# Patient Record
Sex: Female | Born: 1980
Health system: Southern US, Community
[De-identification: ages and names within clinical notes are randomized; demographics above are authoritative.]

## PROBLEM LIST (undated history)

## (undated) HISTORY — PX: BACK SURGERY: SHX140

---

## 2007-08-06 ENCOUNTER — Ambulatory Visit (HOSPITAL_COMMUNITY): Admission: RE | Admit: 2007-08-06 | Discharge: 2007-08-06 | Payer: Self-pay | Admitting: Obstetrics & Gynecology

## 2007-10-21 ENCOUNTER — Inpatient Hospital Stay (HOSPITAL_COMMUNITY): Admission: AD | Admit: 2007-10-21 | Discharge: 2007-10-24 | Payer: Self-pay | Admitting: Obstetrics & Gynecology

## 2008-12-15 ENCOUNTER — Ambulatory Visit (HOSPITAL_COMMUNITY): Admission: RE | Admit: 2008-12-15 | Discharge: 2008-12-15 | Payer: Self-pay | Admitting: Family Medicine

## 2009-10-16 IMAGING — CT CT PELVIS W/ CM
1 series · 15 of 32 positions shown, 19 images · IV contrast (agent unspecified)
Comparison: None

CT ABDOMEN

CLINICAL DATA: Fever.  Right lower quadrant pain.

CT ABDOMEN AND PELVIS WITH CONTRAST
TECHNIQUE: Multidetector CT imaging of the abdomen and pelvis was
performed using the standard protocol following bolus
administration of intravenous contrast.
Contrast: 100 ml of omni 300.,

[Series 2: rtn ap with st · axial · 0.62mm/px · z∈[-412,+3]mm · 15 of 92 slices shown, 19 images]
[im 6/92  soft-tissue]
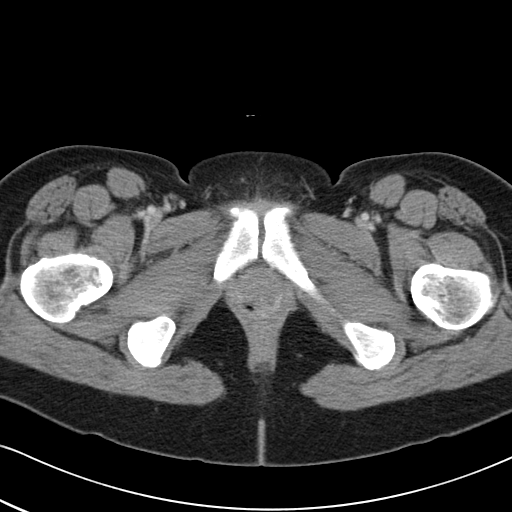
[im 6/92  bone]
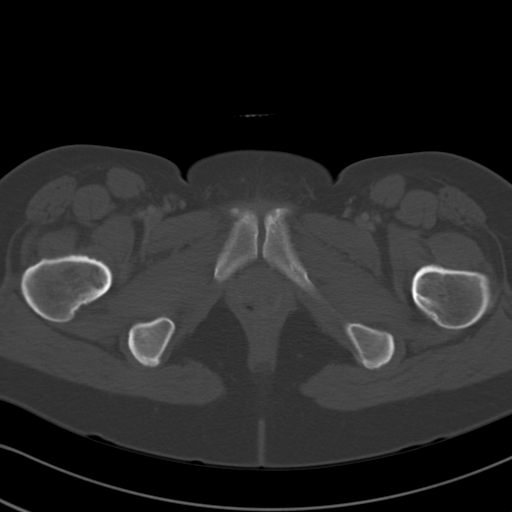
[im 12/92  soft-tissue]
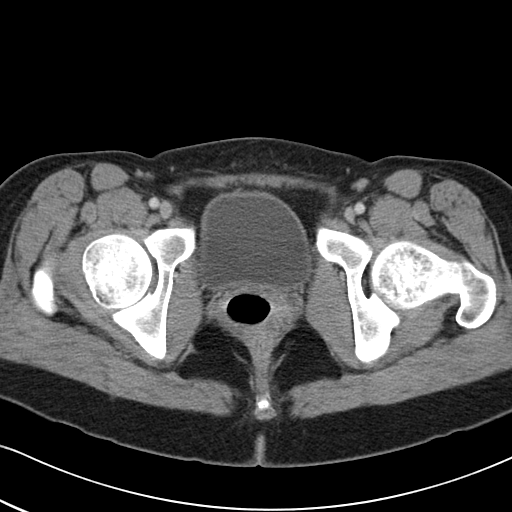
[im 18/92  soft-tissue]
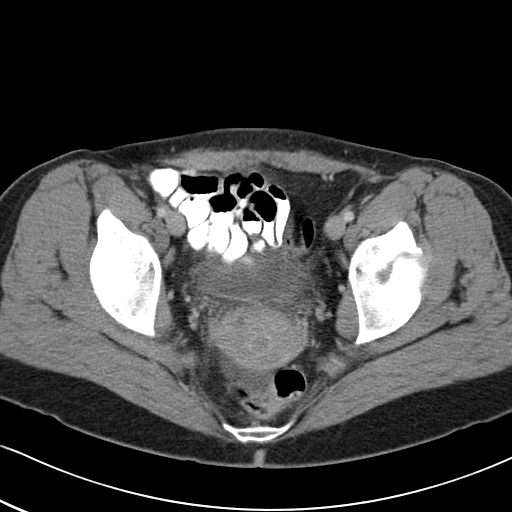
[im 27/92  soft-tissue]
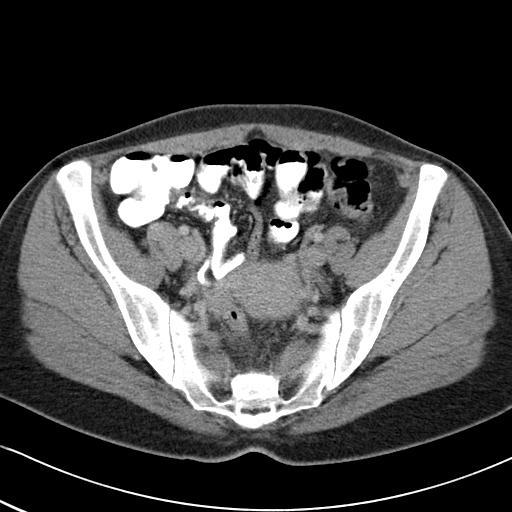
[im 33/92  soft-tissue]
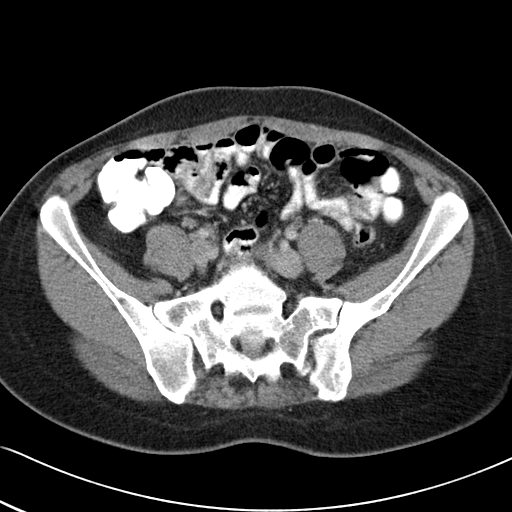
[im 39/92  soft-tissue]
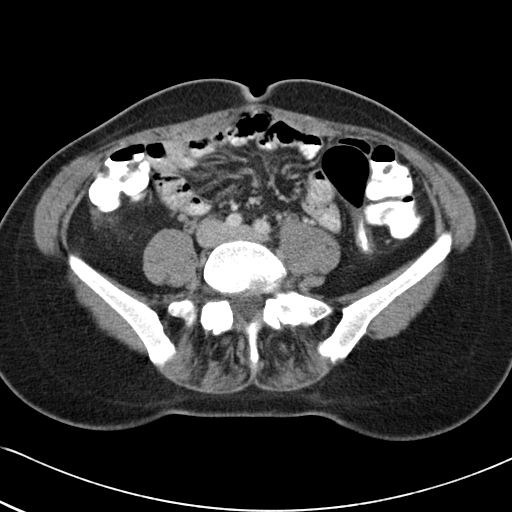
[im 47/92  soft-tissue]
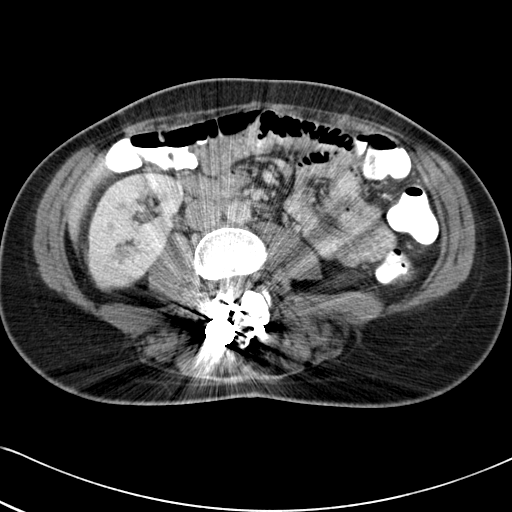
[im 53/92  soft-tissue]
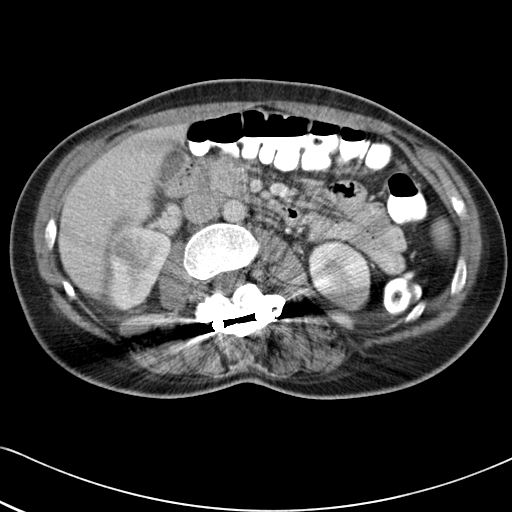
[im 59/92  soft-tissue]
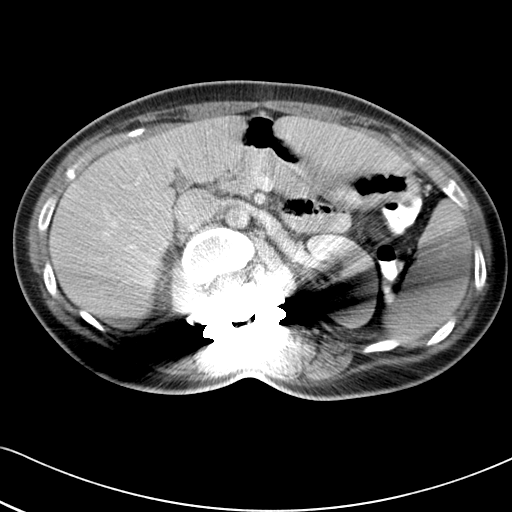
[im 59/92  bone]
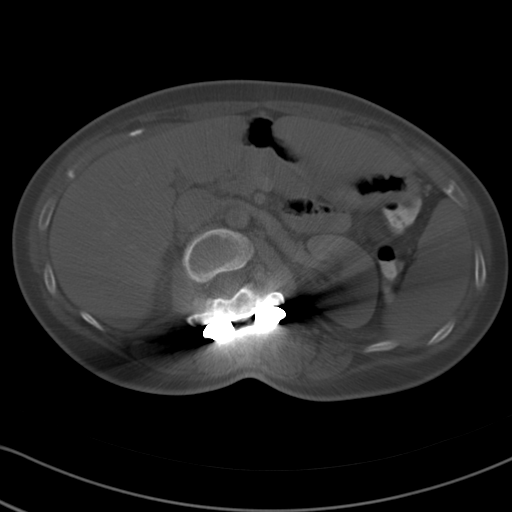
[im 65/92  soft-tissue]
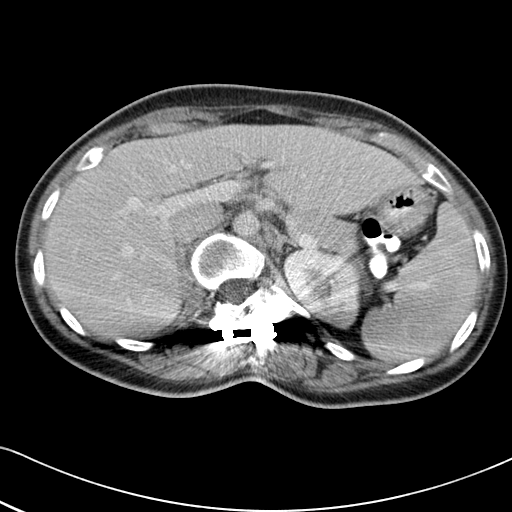
[im 74/92  soft-tissue]
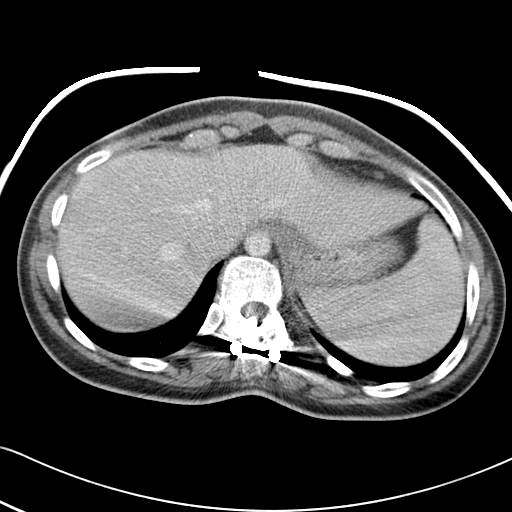
[im 80/92  soft-tissue]
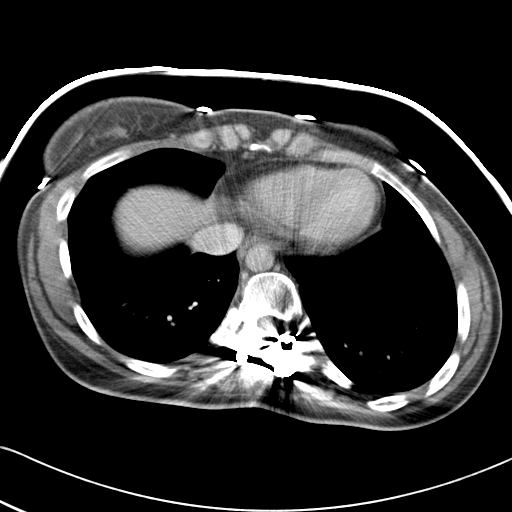
[im 80/92  lung]
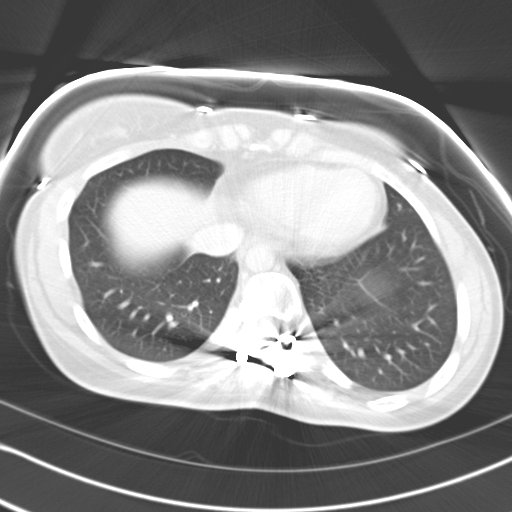
[im 83/92  lung]
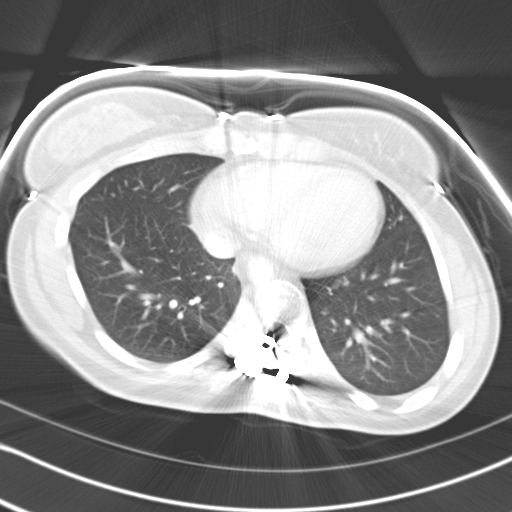
[im 86/92  soft-tissue]
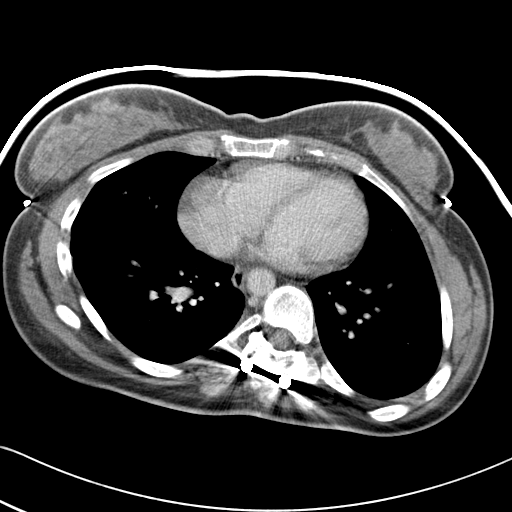
[im 86/92  lung]
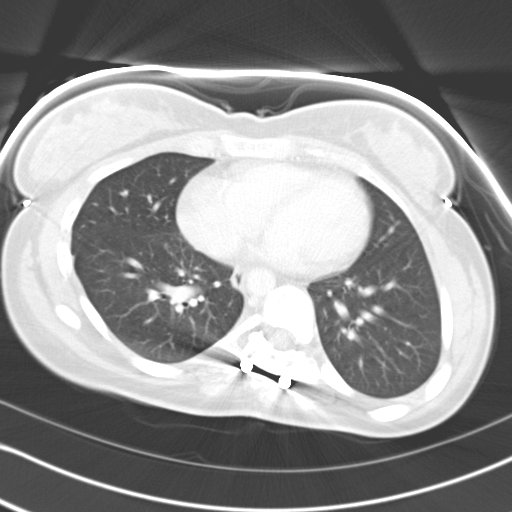
[im 89/92  lung]
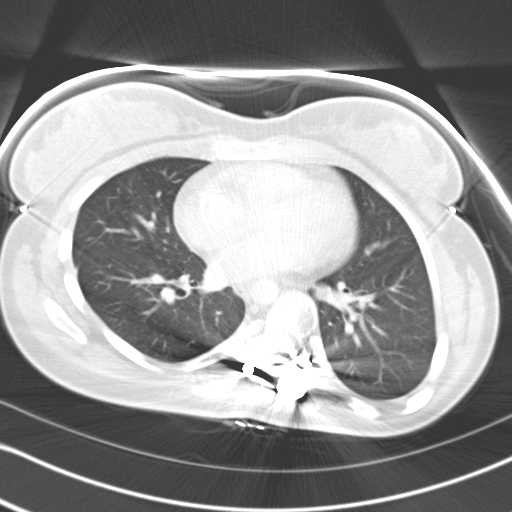

[15 of 32 positions shown; findings below may reference images not displayed]

FINDINGS: The lung bases are clear.

The spleen is 12 cm in craniocaudal dimension.

The adrenal glands are negative.

The liver is normal.

The gallbladder is normal.

There is no biliary dilatation.

The pancreas is normal.

Evaluation of the kidneys is limited due to streak artifact from a
scoliosis rods.  Within the upper pole of the right kidney there is
a regional area of decreased perfusion measuring 17 mm, image 39 of
the coronal series.  There is mild right-sided peri nephric fat
stranding.  Mild right-sided hydroureter and urothelial enhancement
is noted.

There is no free fluid within the upper abdomen.

The bowel loops are normal in course and caliber.

The appendix is normal.

Review of the visualized osseous structures is unremarkable.
IMPRESSION: 1.  Evaluation of the kidneys is limited due to streak artifact
from a scoliosis rods.  Within this limitation there is an
asymmetric area of low density within the upper pole of the right
kidney.  The appearance is nonspecific.  If there are clinical
signs or symptoms of pyelonephritis this may represent an area of
lumbar nephronia.

CT PELVIS
FINDINGS: There is a small amount of free fluid within the pelvis.

The urinary bladder appears normal.

The pelvic bowel loops are unremarkable.

A tampon is identified within the vagina.  T2-2 The uterus and the
adnexal structures have a normal appearance

There are is no evidence for pelvic abscess or mass.

Review of the visualized osseous structures is unremarkable.
IMPRESSION: 1.  Small amount of free fluid within the pelvis.  In a
premenopausal female this may be physiologic.

## 2010-01-28 ENCOUNTER — Inpatient Hospital Stay (HOSPITAL_COMMUNITY): Admission: RE | Admit: 2010-01-28 | Discharge: 2010-01-31 | Payer: Self-pay | Admitting: Obstetrics & Gynecology

## 2010-01-31 ENCOUNTER — Encounter: Admission: RE | Admit: 2010-01-31 | Discharge: 2010-03-02 | Payer: Self-pay | Admitting: Obstetrics & Gynecology

## 2010-03-03 ENCOUNTER — Encounter: Admission: RE | Admit: 2010-03-03 | Discharge: 2010-03-10 | Payer: Self-pay | Admitting: Obstetrics & Gynecology

## 2010-11-28 LAB — CBC
HCT: 29.8 % — ABNORMAL LOW (ref 36.0–46.0)
Hemoglobin: 10.4 g/dL — ABNORMAL LOW (ref 12.0–15.0)
MCHC: 34.4 g/dL (ref 30.0–36.0)
MCHC: 34.9 g/dL (ref 30.0–36.0)
MCV: 86.6 fL (ref 78.0–100.0)
Platelets: 160 K/uL (ref 150–400)
Platelets: 203 10*3/uL (ref 150–400)
RBC: 3.45 MIL/uL — ABNORMAL LOW (ref 3.87–5.11)
RDW: 13.3 % (ref 11.5–15.5)
RDW: 13.4 % (ref 11.5–15.5)
WBC: 12.6 K/uL — ABNORMAL HIGH (ref 4.0–10.5)

## 2010-11-28 LAB — RH IMMUNE GLOB WKUP(>/=20WKS)(NOT WOMEN'S HOSP): Fetal Screen: NEGATIVE

## 2011-01-24 NOTE — Discharge Summary (Signed)
Emma Stephenson, Emma Stephenson                   ACCOUNT NO.:  [id number]   MEDICAL RECORD NO.:  [id number]          PATIENT TYPE:  INP   LOCATION:  9132                          FACILITY:  [facility]   PHYSICIAN:  Gerrit Friends. Aldona Bar, M.D.   DATE OF BIRTH:  1981-06-14   DATE OF ADMISSION:  10/21/2007  DATE OF DISCHARGE:  10/24/2007                               DISCHARGE SUMMARY   DISCHARGE DIAGNOSES:  1. Term pregnancy, delivered, 6 pound 4 ounce female infant, Apgars 8      and 9.  2. Blood type A negative - RhoGAM given.  3. Desire for primary low-transverse cesarean section secondary to      previous back surgery and recommendation not to labor.   PROCEDURE:  Primary low-transverse cesarean section.   SUMMARY:  This 30 year old primigravida who is scheduled for primary low-  transverse cesarean section on October 25, 2007, at term, presented in  active labor on October 21, 2007, and because of her request to be  delivered by primary low-transverse cesarean section and not to labor,  she was ultimately taken to the operating room and delivered by primary  low-transverse cesarean section.  A spinal anesthetic was attempted, but  because of the patient's previous surgery for severe scoliosis, was not  successful and general endotracheal anesthesia was required.  Cesarean  section went well and she was delivered of a 6 pound 4 ounce female infant  with Apgars of 8 and 9 and the patient's postoperative course was  totally benign.  She was breast-feeding without difficulty.  She did  receive RhoGAM postpartum.  Her discharge hemoglobin was 11.2 with a  white count of 18,300 and a platelet count of 180,000.  She remained  afebrile during her entire hospital course.  On the morning of October 24, 2007, her wound was clean and dry.  Her staples were removed and  wound was Steri-Stripped with Benzoin.  She was given all appropriate  instructions per discharge brochure and understood all instructions  well.   DISCHARGE MEDICATIONS:  1. Vitamins - one a day as long she is breast-feeding.  2. Tylox one to two every 4-6 hours as needed for severe pain.  3. Motrin 600 mg every 6 hours as needed for cramping or less severe      pain.   FOLLOW UP:  She will return the office for follow-up in approximately  four weeks' time or as needed.   CONDITION ON DISCHARGE:  Improved.      Gerrit Friends. Aldona Bar, M.D.  Electronically Signed     RMW/MEDQ  D:  10/24/2007  T:  10/25/2007  Job:  [id number]

## 2011-01-24 NOTE — Op Note (Signed)
Emma Stephenson                   ACCOUNT NO.:  [id number]   MEDICAL RECORD NO.:  [id number]          PATIENT TYPE:  INP   LOCATION:  9132                          FACILITY:  [facility]   PHYSICIAN:  Gerrit Friends. Aldona Bar, M.D.   DATE OF BIRTH:  1981-04-24   DATE OF PROCEDURE:  10/21/2007  DATE OF DISCHARGE:                               OPERATIVE REPORT   PREOPERATIVE DIAGNOSIS:  Term pregnancy, early active labor, request for  primary cesarean section because of previous recommendation not to labor  secondary to previous back surgery.   POSTOPERATIVE DIAGNOSIS:  Term pregnancy, early active labor, request  for primary cesarean section because of previous recommendation not to  labor secondary to previous back surgery, delivery of 6 pounds 4 ounces  female infant, Apgars 08/09.   PROCEDURE:  Primary low transverse cesarean section.   SURGEON:  Gerrit Friends. Aldona Bar, M.D.   ANESTHESIA:  Attempted spinal unsuccessful.  Subsequent general  endotracheal.   HISTORY:  This 30 year old primigravida was scheduled for a primary low  transverse cesarean section on 2/13.  She was seen in the office on the  morning of 02/09 and possibly felt to be in early active labor.  She  called several hours later noting that she was having increasing  contractions and discomfort and was advised to come to [facility]  for delivery.   The patient has had previous surgery on her back-1997-she had history of  scoliosis and had a posterior spinal fusion with instrumentation from T3-  L3 and the left iliac crest bone graft.   Once in the operating room attempt was made to carry out a spinal  anesthetic.  At was possible to aspirate spinal fluid upon doing the  spinal, but after injection of medication, no effect was noted and  therefore the patient was prepped and draped with Foley catheter  inserted and general endotracheal anesthesia carried out without  difficulty.   Once the patient was asleep and intubated, a  Pfannenstiel incision was  made with minimal difficulty, dissected down sharply to and through the  fascia in a low transverse fashion with hemostasis created each layer.  Subfascial space was created inferiorly and superiorly, muscles  separated midline, peritoneum identified and entered appropriately with  care taken to avoid the bowel superiorly and the bladder inferiorly.   This time with peritoneum opened the vesicouterine peritoneum was  incised in low transverse fashion and pushed off the lower uterine  segment with ease.  There was some dark blood noted in the abdominal  cavity.   At this time sharp incision into the lower segment was made with  Metzenbaum scissors, extended laterally with fingers.  Amniotomy  produced production of clear fluid and without much difficulty all of  the vacuum extractor was required delivery of viable female infant which  cried spontaneously once was carried out from vertex position.  After  cord was clamped and cut the infant was passed off to the waiting team  and subsequently taken to nursery in good condition.  Subsequent weight  was found to be 6  pounds 4 ounces and Apgars were noted to be 08/09.   Cord bloods were collected and placenta was thereafter delivered intact.  The uterus was exteriorized, rendered free of any remaining products of  conception and good uterine contractility was afforded with slowly given  intravenous Pitocin and manual stimulation.  Tubes and ovaries appeared  normal.  Uterus appeared normal, there was no evidence of anywhere that  the blood that was noted upon entering the abdominal cavity might have  been coming from.   At this time closure of the uterine incision was carried out using a  single layer of #1 Vicryl in running locking fashion.  This was  reinforced with several figure-of-eight #1 Vicryls.  At this time the  uterine incision was noted to be dry.  Tubes and ovaries noted to be  normal, uterus well  contracted and after the abdomen lavaged of all free  blood and clot with all counts being correct and no foreign bodies noted  to be remaining abdominal cavity, once the uterus was replaced in the  abdominal cavity closure of the abdomen was begun in layers.  The  abdominal peritoneum was closed with 0-0 Vicryl running fashion.  Muscle  secured with same.  Assured of fascial hemostasis, the fascia was then  reapproximated using 0-0 Vicryl from angle to midline bilaterally.  Subcutaneous tissues were rendered hemostatic and staples were used to  close skin.  Sterile pressure dressing was applied and the patient at  this time was transported to recovery room in satisfactory  condition,  having tolerated the procedure well.  Estimated blood loss 500 mL.  All  counts correct x2.  In summary this patient presented in early active  labor at 38-39 weeks pregnancy and was previously scheduled for February  13 for delivery requesting a primary low transverse cesarean section  because of previous recommendation not to labor secondary to her  previous back surgery which was done in 1997 for severe scoliosis.  She  was delivered of a 6 pounds 4 ounces female infant with good Apgars and at  the conclusion of procedure both mother and baby were doing well.      Gerrit Friends. Aldona Bar, M.D.  Electronically Signed     RMW/MEDQ  D:  10/21/2007  T:  10/22/2007  Job:  [id number]

## 2011-06-02 LAB — CBC
HCT: 32.2 — ABNORMAL LOW
Hemoglobin: 13.3
MCHC: 34.6
MCV: 88.5
MCV: 88.6
Platelets: 180
RBC: 4.35
RDW: 13.1
RDW: 13.3
WBC: 18.3 — ABNORMAL HIGH

## 2011-06-02 LAB — RH IMMUNE GLOB WKUP(>/=20WKS)(NOT WOMEN'S HOSP)

## 2011-06-02 LAB — RPR: RPR Ser Ql: NONREACTIVE

## 2011-06-20 LAB — RH IMMUNE GLOBULIN WORKUP (NOT WOMEN'S HOSP)
ABO/RH(D): A NEG
Antibody Screen: NEGATIVE

## 2017-05-15 DIAGNOSIS — Z01419 Encounter for gynecological examination (general) (routine) without abnormal findings: Secondary | ICD-10-CM | POA: Diagnosis not present

## 2017-05-15 DIAGNOSIS — Z6823 Body mass index (BMI) 23.0-23.9, adult: Secondary | ICD-10-CM | POA: Diagnosis not present

## 2018-05-17 DIAGNOSIS — M545 Low back pain: Secondary | ICD-10-CM | POA: Diagnosis not present

## 2018-06-04 DIAGNOSIS — Z01419 Encounter for gynecological examination (general) (routine) without abnormal findings: Secondary | ICD-10-CM | POA: Diagnosis not present

## 2018-06-04 DIAGNOSIS — Z124 Encounter for screening for malignant neoplasm of cervix: Secondary | ICD-10-CM | POA: Diagnosis not present

## 2018-06-04 DIAGNOSIS — Z6824 Body mass index (BMI) 24.0-24.9, adult: Secondary | ICD-10-CM | POA: Diagnosis not present

## 2018-06-17 DIAGNOSIS — Z124 Encounter for screening for malignant neoplasm of cervix: Secondary | ICD-10-CM | POA: Diagnosis not present

## 2018-11-04 DIAGNOSIS — J0140 Acute pansinusitis, unspecified: Secondary | ICD-10-CM | POA: Diagnosis not present

## 2018-11-15 ENCOUNTER — Ambulatory Visit: Payer: Self-pay | Admitting: Physician Assistant

## 2018-11-15 DIAGNOSIS — R509 Fever, unspecified: Secondary | ICD-10-CM | POA: Diagnosis not present

## 2018-11-15 DIAGNOSIS — J111 Influenza due to unidentified influenza virus with other respiratory manifestations: Secondary | ICD-10-CM | POA: Diagnosis not present

## 2018-11-15 DIAGNOSIS — R112 Nausea with vomiting, unspecified: Secondary | ICD-10-CM | POA: Diagnosis not present

## 2018-11-16 ENCOUNTER — Other Ambulatory Visit: Payer: Self-pay

## 2018-11-16 ENCOUNTER — Encounter (HOSPITAL_COMMUNITY): Payer: Self-pay | Admitting: Emergency Medicine

## 2018-11-16 ENCOUNTER — Emergency Department (HOSPITAL_COMMUNITY)
Admission: EM | Admit: 2018-11-16 | Discharge: 2018-11-16 | Disposition: A | Discharge status: Home / Self Care | Payer: BLUE CROSS/BLUE SHIELD | Attending: Emergency Medicine | Admitting: Emergency Medicine

## 2018-11-16 DIAGNOSIS — R112 Nausea with vomiting, unspecified: Secondary | ICD-10-CM | POA: Insufficient documentation

## 2018-11-16 DIAGNOSIS — R197 Diarrhea, unspecified: Secondary | ICD-10-CM | POA: Insufficient documentation

## 2018-11-16 DIAGNOSIS — I959 Hypotension, unspecified: Secondary | ICD-10-CM | POA: Diagnosis not present

## 2018-11-16 DIAGNOSIS — E860 Dehydration: Secondary | ICD-10-CM | POA: Diagnosis not present

## 2018-11-16 DIAGNOSIS — Z79899 Other long term (current) drug therapy: Secondary | ICD-10-CM | POA: Insufficient documentation

## 2018-11-16 DIAGNOSIS — R509 Fever, unspecified: Secondary | ICD-10-CM | POA: Diagnosis not present

## 2018-11-16 DIAGNOSIS — R000 Tachycardia, unspecified: Secondary | ICD-10-CM | POA: Insufficient documentation

## 2018-11-16 LAB — BASIC METABOLIC PANEL
Anion gap: 8 (ref 5–15)
BUN: 11 mg/dL (ref 6–20)
CALCIUM: 8 mg/dL — AB (ref 8.9–10.3)
CO2: 22 mmol/L (ref 22–32)
CREATININE: 1.02 mg/dL — AB (ref 0.44–1.00)
Chloride: 109 mmol/L (ref 98–111)
Glucose, Bld: 126 mg/dL — ABNORMAL HIGH (ref 70–99)
Potassium: 3.2 mmol/L — ABNORMAL LOW (ref 3.5–5.1)
SODIUM: 139 mmol/L (ref 135–145)

## 2018-11-16 LAB — CBC WITH DIFFERENTIAL/PLATELET
Abs Immature Granulocytes: 0.09 10*3/uL — ABNORMAL HIGH (ref 0.00–0.07)
BASOS ABS: 0 10*3/uL (ref 0.0–0.1)
Basophils Relative: 0 %
EOS PCT: 0 %
Eosinophils Absolute: 0 10*3/uL (ref 0.0–0.5)
HCT: 35.8 % — ABNORMAL LOW (ref 36.0–46.0)
HEMOGLOBIN: 11.7 g/dL — AB (ref 12.0–15.0)
Immature Granulocytes: 1 %
LYMPHS PCT: 2 %
Lymphs Abs: 0.3 10*3/uL — ABNORMAL LOW (ref 0.7–4.0)
MCH: 29.1 pg (ref 26.0–34.0)
MCHC: 32.7 g/dL (ref 30.0–36.0)
MCV: 89.1 fL (ref 80.0–100.0)
Monocytes Absolute: 1.1 10*3/uL — ABNORMAL HIGH (ref 0.1–1.0)
Monocytes Relative: 9 %
NRBC: 0 % (ref 0.0–0.2)
Neutro Abs: 10.8 10*3/uL — ABNORMAL HIGH (ref 1.7–7.7)
Neutrophils Relative %: 88 %
PLATELETS: 165 10*3/uL (ref 150–400)
RBC: 4.02 MIL/uL (ref 3.87–5.11)
RDW: 12.1 % (ref 11.5–15.5)
WBC: 12.3 10*3/uL — AB (ref 4.0–10.5)

## 2018-11-16 LAB — INFLUENZA PANEL BY PCR (TYPE A & B)
INFLAPCR: NEGATIVE
Influenza B By PCR: NEGATIVE

## 2018-11-16 LAB — LACTIC ACID, PLASMA: LACTIC ACID, VENOUS: 1.2 mmol/L (ref 0.5–1.9)

## 2018-11-16 MED ORDER — LORAZEPAM 2 MG/ML IJ SOLN
1.0000 mg | Freq: Once | INTRAMUSCULAR | Status: AC
  Administered 2018-11-16: 1 mg via INTRAVENOUS
  Filled 2018-11-16: qty 1

## 2018-11-16 MED ORDER — SODIUM CHLORIDE 0.9 % IV BOLUS
1000.0000 mL | Freq: Once | INTRAVENOUS | Status: AC
  Administered 2018-11-16: 1000 mL via INTRAVENOUS

## 2018-11-16 MED ORDER — ACETAMINOPHEN 325 MG PO TABS
650.0000 mg | ORAL_TABLET | Freq: Once | ORAL | Status: AC
  Administered 2018-11-16: 650 mg via ORAL
  Filled 2018-11-16: qty 2

## 2018-11-16 MED ORDER — KETOROLAC TROMETHAMINE 30 MG/ML IJ SOLN
15.0000 mg | Freq: Once | INTRAMUSCULAR | Status: AC
  Administered 2018-11-16: 15 mg via INTRAVENOUS
  Filled 2018-11-16: qty 1

## 2018-11-16 NOTE — Discharge Instructions (Addendum)
Use Tylenol or Motrin for pain and fever.  Use Kaopectate or Imodium for diarrhea.  Start with clear liquids then gradually advance to bland foods.  There is no reason to continue taking the Tamiflu because the flu tests are negative.  Tamiflu sometimes makes vomiting, worse.  Return here, if needed, for problems.

## 2018-11-16 NOTE — ED Triage Notes (Signed)
Pt reports [facility] but [facility] not have positive test.  Was started on Tamiflu and has had 3 doses.  Has been unable to keep anything down x 2 days.  Today was feeling a little better and got up to take a shower and while in the shower started shaking badly and her husband had to get her dressed.  Initial HR 160s by ems.

## 2018-11-16 NOTE — ED Notes (Signed)
Pt given ginger ale.

## 2018-11-16 NOTE — ED Notes (Signed)
ED Provider at bedside. 

## 2018-11-16 NOTE — ED Provider Notes (Signed)
[facility] EMERGENCY DEPARTMENT Provider Note   CSN: [id number] Arrival date & time: 11/16/18  1529    History   Chief Complaint Chief Complaint  Patient presents with  . Tachycardia    HPI Emma Stephenson is a 38 y.o. female.     HPI   She complains of fever, nausea and vomiting since yesterday.  She is unable to tolerate anything orally without vomiting.  There is been no blood in the emesis.  There is been no diarrhea.  She has had some sneezing.  No known sick contacts.  She works in a [employer] affairs [employer]."  No other recent illnesses.  No travel outside the country.  There are no other known modifying factors.  History reviewed. No pertinent past medical history.  There are no active problems to display for this patient.   Past Surgical History:  Procedure Laterality Date  . BACK SURGERY       OB History   No obstetric history on file.      Home Medications    Prior to Admission medications   Medication Sig Start Date End Date Taking? Authorizing Provider  acetaminophen (TYLENOL) 325 MG tablet Take 325 mg by mouth every 6 (six) hours as needed.   Yes [provider]  norethindrone-ethinyl estradiol (JUNEL FE,GILDESS FE,LOESTRIN FE) 1-20 MG-MCG tablet Take 1 tablet by mouth daily.   Yes [provider]    Family History History reviewed. No pertinent family history.  Social History Social History   Tobacco Use  . Smoking status: Never Smoker  . Smokeless tobacco: Never Used  Substance Use Topics  . Alcohol use: Yes    Comment: occasional  . Drug use: Never     Allergies   Patient has no known allergies.   Review of Systems Review of Systems  All other systems reviewed and are negative.    Physical Exam Updated Vital Signs BP 107/64   Pulse (!) 102   Temp 99.4 F (37.4 C) (Oral)   Resp (!) 29   Ht 5\' 5"  (1.651 m)   Wt 68 kg   LMP 11/04/2018   SpO2 97%   BMI 24.96 kg/m   Physical Exam Vitals signs and nursing  note reviewed.  Constitutional:      General: She is not in acute distress.    Appearance: Normal appearance. She is well-developed. She is not ill-appearing, toxic-appearing or diaphoretic.  HENT:     Head: Normocephalic and atraumatic.     Right Ear: External ear normal.     Left Ear: External ear normal.  Eyes:     Conjunctiva/sclera: Conjunctivae normal.     Pupils: Pupils are equal, round, and reactive to light.  Neck:     Musculoskeletal: Normal range of motion and neck supple.     Trachea: Phonation normal.  Cardiovascular:     Rate and Rhythm: Regular rhythm. Tachycardia present.     Heart sounds: Murmur (Systolic) present. No friction rub. No gallop.   Pulmonary:     Effort: Pulmonary effort is normal. No respiratory distress.     Breath sounds: Normal breath sounds. No stridor. No rhonchi.  Abdominal:     General: There is no distension.     Palpations: Abdomen is soft.     Tenderness: There is no abdominal tenderness. There is no guarding.     Hernia: No hernia is present.  Musculoskeletal: Normal range of motion.  Skin:    General: Skin is warm and dry.  Neurological:     Mental Status: She is alert and oriented to person, place, and time.     Cranial Nerves: No cranial nerve deficit.     Sensory: No sensory deficit.     Motor: No abnormal muscle tone.     Coordination: Coordination normal.  Psychiatric:        Mood and Affect: Mood normal.        Behavior: Behavior normal.        Thought Content: Thought content normal.        Judgment: Judgment normal.      ED Treatments / Results  Labs (all labs ordered are listed, but only abnormal results are displayed) Labs Reviewed  BASIC METABOLIC PANEL - Abnormal; Notable for the following components:      Result Value   Potassium 3.2 (*)    Glucose, Bld 126 (*)    Creatinine, Ser 1.02 (*)    Calcium 8.0 (*)    All other components within normal limits  CBC WITH DIFFERENTIAL/PLATELET - Abnormal; Notable for  the following components:   WBC 12.3 (*)    Hemoglobin 11.7 (*)    HCT 35.8 (*)    Neutro Abs 10.8 (*)    Lymphs Abs 0.3 (*)    Monocytes Absolute 1.1 (*)    Abs Immature Granulocytes 0.09 (*)    All other components within normal limits  LACTIC ACID, PLASMA  INFLUENZA PANEL BY PCR (TYPE A & B)  LACTIC ACID, PLASMA    EKG EKG Interpretation  Date/Time:  Saturday November 16 2018 15:36:28 EST Ventricular Rate:  129 PR Interval:    QRS Duration: 71 QT Interval:  321 QTC Calculation: 471 R Axis:   46 Text Interpretation:  Sinus tachycardia Consider right atrial enlargement Nonspecific repol abnormality, diffuse leads Baseline wander in lead(s) II III aVF No old tracing to compare Confirmed by Mancel Bale [id number]) on 11/16/2018 3:49:47 PM   Radiology No results found.  Procedures Procedures (including critical care time)  Medications Ordered in ED Medications  sodium chloride 0.9 % bolus 1,000 mL (0 mLs Intravenous Stopped 11/16/18 1642)  acetaminophen (TYLENOL) tablet 650 mg (650 mg Oral Given 11/16/18 1810)  ketorolac (TORADOL) 30 MG/ML injection 15 mg (15 mg Intravenous Given 11/16/18 1833)  LORazepam (ATIVAN) injection 1 mg (1 mg Intravenous Given 11/16/18 1835)     Initial Impression / Assessment and Plan / ED Course  I have reviewed the triage vital signs and the nursing notes.  Pertinent labs & imaging results that were available during my care of the patient were reviewed by me and considered in my medical decision making (see chart for details).  Clinical Course as of Nov 15 2098  Sat Nov 16, 2018  1823 Normal  Lactic acid, plasma [EW]  1823 Normal except white count high, hemoglobin low  CBC with Differential(!) [EW]  1823 Normal except potassium low, glucose high, creatinine high, calcium low  Basic metabolic panel(!) [EW]  1823 Normal  Influenza panel by PCR (type A & B) [EW]  1828 Currently, the patient is shaking and states that her legs hurt.  Temperature was  rechecked at this time and is normal.  Patient states that she "feels awful."  Patient's husband is here and we discussed the findings which are reassuring.  Patient appears to have an intestinal virus.   [EW]    Clinical Course User Index [EW] Mancel Bale, MD        Patient Vitals for the past  24 hrs:  BP Temp Temp src Pulse Resp SpO2 Height Weight  11/16/18 2000 107/64 - - (!) 102 (!) 29 97 % - -  11/16/18 1930 114/65 - - 94 (!) 26 98 % - -  11/16/18 1900 116/77 - - 98 19 99 % - -  11/16/18 1842 - 99.4 F (37.4 C) Oral - - - - -  11/16/18 1827 - 98.4 F (36.9 C) Oral - - - - -  11/16/18 1800 116/68 - - 85 (!) 25 98 % - -  11/16/18 1730 99/65 - - 86 (!) 23 97 % - -  11/16/18 1700 103/66 - - 98 (!) 25 98 % - -  11/16/18 1635 (!) 113/59 - - 95 18 98 % - -  11/16/18 1535 - - - - - - 5\' 5"  (1.651 m) 68 kg  11/16/18 1534 (!) 121/57 99.9 F (37.7 C) Oral (!) 134 (!) 23 97 % - -    9:00 PM Reevaluation with update and discussion. After initial assessment and treatment, an updated evaluation reveals she is much more comfortable now, tolerating liquids and not feeling nauseated.  The trembling in her lEgs has resolved.  Findings discussed with patient, and husband, all questions answered.  She states that she has Zofran at home. Mancel Bale   Medical Decision Making: Evaluation is consistent with nonspecific nausea, vomiting and diarrhea.  Patient is metabolically stable.  Doubt hemodynamic instability or serious bacterial infection.  CRITICAL CARE-no Performed by: Mancel Bale  Nursing Notes Reviewed/ Care Coordinated Applicable Imaging Reviewed Interpretation of Laboratory Data incorporated into ED treatment  The patient appears reasonably screened and/or stabilized for discharge and I doubt any other medical condition or other [facility] requiring further screening, evaluation, or treatment in the ED at this time prior to discharge.  Plan: Home Medications-OTC analgesia of choice,  current meds except Tamiflu; Home Treatments-gradual advance diet; return here if the recommended treatment, does not improve the symptoms; Recommended follow up-PCP, PRN   Final Clinical Impressions(s) / ED Diagnoses   Final diagnoses:  Nausea vomiting and diarrhea    ED Discharge Orders    None       Mancel Bale, MD 11/18/18 [phone]

## 2018-11-16 NOTE — ED Notes (Signed)
Pt called out and states she all of a sudden started shaking and her legs hurting.

## 2018-11-18 ENCOUNTER — Emergency Department (HOSPITAL_COMMUNITY): Payer: BLUE CROSS/BLUE SHIELD

## 2018-11-18 ENCOUNTER — Other Ambulatory Visit: Payer: Self-pay

## 2018-11-18 ENCOUNTER — Emergency Department (HOSPITAL_COMMUNITY)
Admission: EM | Admit: 2018-11-18 | Discharge: 2018-11-18 | Disposition: A | Discharge status: Home / Self Care | Payer: BLUE CROSS/BLUE SHIELD | Attending: Emergency Medicine | Admitting: Emergency Medicine

## 2018-11-18 ENCOUNTER — Encounter (HOSPITAL_COMMUNITY): Payer: Self-pay

## 2018-11-18 DIAGNOSIS — N12 Tubulo-interstitial nephritis, not specified as acute or chronic: Secondary | ICD-10-CM | POA: Diagnosis not present

## 2018-11-18 DIAGNOSIS — R1013 Epigastric pain: Secondary | ICD-10-CM | POA: Diagnosis not present

## 2018-11-18 DIAGNOSIS — R112 Nausea with vomiting, unspecified: Secondary | ICD-10-CM | POA: Diagnosis not present

## 2018-11-18 DIAGNOSIS — Z79899 Other long term (current) drug therapy: Secondary | ICD-10-CM | POA: Diagnosis not present

## 2018-11-18 DIAGNOSIS — R1110 Vomiting, unspecified: Secondary | ICD-10-CM

## 2018-11-18 DIAGNOSIS — R1010 Upper abdominal pain, unspecified: Secondary | ICD-10-CM | POA: Diagnosis not present

## 2018-11-18 LAB — URINALYSIS, ROUTINE W REFLEX MICROSCOPIC
Bilirubin Urine: NEGATIVE
GLUCOSE, UA: NEGATIVE mg/dL
Ketones, ur: 5 mg/dL — AB
NITRITE: POSITIVE — AB
PH: 5 (ref 5.0–8.0)
PROTEIN: 100 mg/dL — AB
Specific Gravity, Urine: 1.02 (ref 1.005–1.030)

## 2018-11-18 LAB — LIPASE, BLOOD: Lipase: 20 U/L (ref 11–51)

## 2018-11-18 LAB — CBC WITH DIFFERENTIAL/PLATELET
Abs Immature Granulocytes: 0.04 10*3/uL (ref 0.00–0.07)
Basophils Absolute: 0 10*3/uL (ref 0.0–0.1)
Basophils Relative: 0 %
Eosinophils Absolute: 0.1 10*3/uL (ref 0.0–0.5)
Eosinophils Relative: 1 %
HCT: 35.6 % — ABNORMAL LOW (ref 36.0–46.0)
Hemoglobin: 11.6 g/dL — ABNORMAL LOW (ref 12.0–15.0)
Immature Granulocytes: 1 %
Lymphocytes Relative: 6 %
Lymphs Abs: 0.6 10*3/uL — ABNORMAL LOW (ref 0.7–4.0)
MCH: 29.1 pg (ref 26.0–34.0)
MCHC: 32.6 g/dL (ref 30.0–36.0)
MCV: 89.4 fL (ref 80.0–100.0)
Monocytes Absolute: 0.9 10*3/uL (ref 0.1–1.0)
Monocytes Relative: 10 %
NEUTROS ABS: 7.1 10*3/uL (ref 1.7–7.7)
Neutrophils Relative %: 82 %
PLATELETS: 205 10*3/uL (ref 150–400)
RBC: 3.98 MIL/uL (ref 3.87–5.11)
RDW: 12.3 % (ref 11.5–15.5)
WBC: 8.6 10*3/uL (ref 4.0–10.5)
nRBC: 0 % (ref 0.0–0.2)

## 2018-11-18 LAB — COMPREHENSIVE METABOLIC PANEL
ALT: 43 U/L (ref 0–44)
AST: 36 U/L (ref 15–41)
Albumin: 3.1 g/dL — ABNORMAL LOW (ref 3.5–5.0)
Alkaline Phosphatase: 95 U/L (ref 38–126)
Anion gap: 10 (ref 5–15)
BUN: 11 mg/dL (ref 6–20)
CO2: 22 mmol/L (ref 22–32)
Calcium: 8.3 mg/dL — ABNORMAL LOW (ref 8.9–10.3)
Chloride: 104 mmol/L (ref 98–111)
Creatinine, Ser: 1 mg/dL (ref 0.44–1.00)
GFR calc Af Amer: >60 mL/min (ref >60)
GFR calc non Af Amer: >60 mL/min (ref >60)
GLUCOSE: 118 mg/dL — AB (ref 70–99)
Potassium: 3.4 mmol/L — ABNORMAL LOW (ref 3.5–5.1)
Sodium: 136 mmol/L (ref 135–145)
Total Bilirubin: 1.1 mg/dL (ref 0.3–1.2)
Total Protein: 6.5 g/dL (ref 6.5–8.1)

## 2018-11-18 LAB — PREGNANCY, URINE: Preg Test, Ur: NEGATIVE

## 2018-11-18 MED ORDER — ONDANSETRON 4 MG PO TBDP: ORAL_TABLET | ORAL | 0 refills | Status: DC

## 2018-11-18 MED ORDER — SODIUM CHLORIDE 0.9 % IV BOLUS
1000.0000 mL | Freq: Once | INTRAVENOUS | Status: AC
  Administered 2018-11-18: 1000 mL via INTRAVENOUS

## 2018-11-18 MED ORDER — ONDANSETRON HCL 4 MG/2ML IJ SOLN
4.0000 mg | Freq: Once | INTRAMUSCULAR | Status: AC
  Administered 2018-11-18: 4 mg via INTRAVENOUS
  Filled 2018-11-18: qty 2

## 2018-11-18 MED ORDER — KETOROLAC TROMETHAMINE 15 MG/ML IJ SOLN
15.0000 mg | Freq: Once | INTRAMUSCULAR | Status: AC
  Administered 2018-11-18: 15 mg via INTRAVENOUS
  Filled 2018-11-18: qty 1

## 2018-11-18 MED ORDER — ACETAMINOPHEN 500 MG PO TABS
1000.0000 mg | ORAL_TABLET | Freq: Once | ORAL | Status: AC
  Administered 2018-11-18: 1000 mg via ORAL
  Filled 2018-11-18: qty 2

## 2018-11-18 MED ORDER — CEPHALEXIN 500 MG PO CAPS: 500.0000 mg | ORAL_CAPSULE | Freq: Two times a day (BID) | ORAL | 0 refills | Status: DC

## 2018-11-18 MED ORDER — SODIUM CHLORIDE 0.9 % IV SOLN
1.0000 g | Freq: Once | INTRAVENOUS | Status: AC
  Administered 2018-11-18: 1 g via INTRAVENOUS
  Filled 2018-11-18: qty 10

## 2018-11-18 NOTE — ED Notes (Signed)
Have given pt crackers and ginger ale

## 2018-11-18 NOTE — ED Notes (Signed)
Pt resting with equal rise and chest fall. Have notified husband of ultrasound results being back

## 2018-11-18 NOTE — Discharge Instructions (Signed)
Take antibiotics as discussed, use Zofran as needed for vomiting.  Stay well-hydrated. Return if unable to keep antibiotics in, recurrent vomiting or worsening symptoms.

## 2018-11-18 NOTE — ED Provider Notes (Signed)
[facility] EMERGENCY DEPARTMENT Provider Note   CSN: [id number] Arrival date & time: 11/18/18  0844    History   Chief Complaint Chief Complaint  Patient presents with  . Follow-up    HPI Emma Stephenson is a 38 y.o. female.     Patient presents with recurrent vomiting, upper abdominal pain, chills and feeling overall unwell since Thursday.  Patient has been seen twice was started on Tamiflu for which she stopped as she was not tolerating after 3 days.  Patient has not had any respiratory symptoms.  Patient was seen recently in the ER for supportive care.  Patient denies any significant sick contacts except for children with sore throat.  Patient was given Zofran on Thursday.  Patient threw up prior to arrival.  Tylenol and ibuprofen regularly without significant improvement.     History reviewed. No pertinent past medical history.  There are no active problems to display for this patient.   Past Surgical History:  Procedure Laterality Date  . BACK SURGERY       OB History   No obstetric history on file.      Home Medications    Prior to Admission medications   Medication Sig Start Date End Date Taking? Authorizing Provider  acetaminophen (TYLENOL) 325 MG tablet Take 325 mg by mouth every 6 (six) hours as needed.   Yes [provider]  norethindrone-ethinyl estradiol (JUNEL FE,GILDESS FE,LOESTRIN FE) 1-20 MG-MCG tablet Take 1 tablet by mouth daily.   Yes [provider]  cephALEXin (KEFLEX) 500 MG capsule Take 1 capsule (500 mg total) by mouth 2 (two) times daily. 11/18/18   Blane Ohara, MD  ondansetron (ZOFRAN ODT) 4 MG disintegrating tablet  ODT q4 hours prn nausea/vomit 11/18/18   Blane Ohara, MD    Family History No family history on file.  Social History Social History   Tobacco Use  . Smoking status: Never Smoker  . Smokeless tobacco: Never Used  Substance Use Topics  . Alcohol use: Yes    Comment: occasional  . Drug use: Never       Allergies   Patient has no known allergies.   Review of Systems Review of Systems  Constitutional: Positive for appetite change, chills and fever.  HENT: Negative for congestion.   Eyes: Negative for visual disturbance.  Respiratory: Negative for shortness of breath.   Cardiovascular: Negative for chest pain.  Gastrointestinal: Positive for nausea and vomiting. Negative for abdominal pain.  Genitourinary: Negative for dysuria and flank pain.  Musculoskeletal: Positive for arthralgias and back pain. Negative for neck pain and neck stiffness.  Skin: Negative for rash.  Neurological: Negative for light-headedness and headaches.     Physical Exam Updated Vital Signs BP (!) 103/53   Pulse 75   Temp 97.9 F (36.6 C) (Oral)   Resp 11   Ht  (1.651 m)   Wt 68 kg   LMP 11/04/2018   SpO2 100%   BMI 24.96 kg/m   Physical Exam Vitals signs and nursing note reviewed.  Constitutional:      Appearance: She is well-developed.  HENT:     Head: Normocephalic and atraumatic.     Comments: Dry mm Eyes:     General:        Right eye: No discharge.        Left eye: No discharge.     Conjunctiva/sclera: Conjunctivae normal.  Neck:     Musculoskeletal: Normal range of motion and neck supple.  Trachea: No tracheal deviation.  Cardiovascular:     Rate and Rhythm: Normal rate and regular rhythm.  Pulmonary:     Effort: Pulmonary effort is normal.     Breath sounds: Normal breath sounds.  Abdominal:     General: There is no distension.     Palpations: Abdomen is soft.     Tenderness: There is abdominal tenderness (mild RUQ/epig). There is no guarding.  Skin:    General: Skin is warm.     Findings: No rash.  Neurological:     Mental Status: She is alert and oriented to person, place, and time.      ED Treatments / Results  Labs (all labs ordered are listed, but only abnormal results are displayed) Labs Reviewed  URINE CULTURE - Abnormal; Notable for the  following components:      Result Value   Culture >=100,000 COLONIES/mL ESCHERICHIA COLI (*)    Organism ID, Bacteria ESCHERICHIA COLI (*)    All other components within normal limits  URINALYSIS, ROUTINE W REFLEX MICROSCOPIC - Abnormal; Notable for the following components:   Color, Urine AMBER (*)    APPearance CLOUDY (*)    Hgb urine dipstick SMALL (*)    Ketones, ur 5 (*)    Protein, ur 100 (*)    Nitrite POSITIVE (*)    Leukocytes,Ua MODERATE (*)    WBC, UA >50 (*)    Bacteria, UA FEW (*)    Non Squamous Epithelial 0-5 (*)    All other components within normal limits  CBC WITH DIFFERENTIAL/PLATELET - Abnormal; Notable for the following components:   Hemoglobin 11.6 (*)    HCT 35.6 (*)    Lymphs Abs 0.6 (*)    All other components within normal limits  COMPREHENSIVE METABOLIC PANEL - Abnormal; Notable for the following components:   Potassium 3.4 (*)    Glucose, Bld 118 (*)    Calcium 8.3 (*)    Albumin 3.1 (*)    All other components within normal limits  LIPASE, BLOOD  PREGNANCY, URINE    EKG None  Radiology No results found.  Procedures Procedures (including critical care time)  Medications Ordered in ED Medications  sodium chloride 0.9 % bolus 1,000 mL (0 mLs Intravenous Stopped 11/18/18 1157)  ondansetron (ZOFRAN) injection 4 mg (4 mg Intravenous Given 11/18/18 0953)  ketorolac (TORADOL) 15 MG/ML injection 15 mg (15 mg Intravenous Given 11/18/18 0953)  cefTRIAXone (ROCEPHIN) 1 g in sodium chloride 0.9 % 100 mL IVPB (0 g Intravenous Stopped 11/18/18 1050)  acetaminophen (TYLENOL) tablet 1,000 mg (1,000 mg Oral Given 11/18/18 1103)     Initial Impression / Assessment and Plan / ED Course  I have reviewed the triage vital signs and the nursing notes.  Pertinent labs & imaging results that were available during my care of the patient were reviewed by me and considered in my medical decision making (see chart for details).       Patient presents with recurrent  vomiting, fever and overall not feeling well.  Patient denies any significant respiratory symptoms.  Urinalysis consistent with significant infection and clinical concern for pyelonephritis.  IV fluids, IV antibiotics given.  Plan for oral antibiotics and close outpatient follow-up.  Blood work reviewed overall reassuring mild anemia. With epigastric discomfort patient had ultrasound done no acute findings of the gallbladder.  Patient improved in the ER and recheck. Results and differential diagnosis were discussed with the patient/parent/guardian. Xrays were independently reviewed by myself.  Close follow up  outpatient was discussed, comfortable with the plan.   Medications  sodium chloride 0.9 % bolus 1,000 mL (0 mLs Intravenous Stopped 11/18/18 1157)  ondansetron (ZOFRAN) injection 4 mg (4 mg Intravenous Given 11/18/18 0953)  ketorolac (TORADOL) 15 MG/ML injection 15 mg (15 mg Intravenous Given 11/18/18 0953)  cefTRIAXone (ROCEPHIN) 1 g in sodium chloride 0.9 % 100 mL IVPB (0 g Intravenous Stopped 11/18/18 1050)  acetaminophen (TYLENOL) tablet 1,000 mg (1,000 mg Oral Given 11/18/18 1103)    Vitals:   11/18/18 0855 11/18/18 1054 11/18/18 1200  BP: 113/66 109/68 (!) 103/53  Pulse: 79 84 75  Resp: 18 13 11   Temp: 97.9 F (36.6 C)    TempSrc: Oral    SpO2: 99% 100% 100%  Weight: 68 kg    Height: 5\' 5"  (1.651 m)      Final diagnoses:  Pyelonephritis  Vomiting in adult  Epigastric pain     Final Clinical Impressions(s) / ED Diagnoses   Final diagnoses:  Pyelonephritis  Vomiting in adult  Epigastric pain    ED Discharge Orders         Ordered    cephALEXin (KEFLEX) 500 MG capsule  2 times daily     11/18/18 1321    ondansetron (ZOFRAN ODT) 4 MG disintegrating tablet     11/18/18 1321           Blane Ohara, MD 11/21/18 1224

## 2018-11-18 NOTE — ED Notes (Signed)
Ultrasound in room with patient.

## 2018-11-18 NOTE — ED Triage Notes (Signed)
Pt has been sick since Thursday. Had a negative flu test. Pt here on Saturday night. States she came in EMS and HR was 165. Was diagnosed with a virus. No fever, but pt is having stomach and back pain. Pt hasn't been able to eat since Thursday. Has been throwing up consistently. Took Tylenol at 0715 this morning with 2 sips of water and threw up 10 mins ago. Was given a prescription for Zofran by PCP on Thursday, but has not taken it.

## 2018-11-20 LAB — URINE CULTURE

## 2018-11-21 ENCOUNTER — Telehealth: Payer: Self-pay

## 2018-11-21 NOTE — Telephone Encounter (Signed)
Post ED Visit - Positive Culture Follow-up  Culture report reviewed by antimicrobial stewardship pharmacist: [facility] []  Enzo Bi, Pharm.D. []  Celedonio Miyamoto, Pharm.D., BCPS AQ-ID []  Garvin Fila, Pharm.D., BCPS []  Georgina Pillion, Pharm.D., BCPS []  [place], [address].D., BCPS, AAHIVP []  Estella Husk, Pharm.D., BCPS, AAHIVP []  Lysle Pearl, PharmD, BCPS []  Phillips Climes, PharmD, BCPS [x]  Agapito Games, PharmD, BCPS []  Verlan Friends, PharmD []  Mervyn Gay, PharmD, BCPS []  Vinnie Level, PharmD  [facility] []  Len Childs, PharmD []  Greer Pickerel, PharmD []  Adalberto Cole, PharmD []  Perlie Gold, Rph []  Lonell Face) Jean Rosenthal, PharmD []  Earl Many, PharmD []  Junita Push, PharmD []  Dorna Leitz, PharmD []  Terrilee Files, PharmD []  Lynann Beaver, PharmD []  Keturah Barre, PharmD []  Loralee Pacas, PharmD []  Bernadene Person, PharmD   Positive urine culture Treated with Cephalexin, organism sensitive to the same and no further patient follow-up is required at this time.  Jerry Caras 11/21/2018, 4:07 PM

## 2018-11-22 ENCOUNTER — Telehealth: Payer: Self-pay | Admitting: Family Medicine

## 2018-11-22 NOTE — Telephone Encounter (Signed)
Patient talked to Emma Stephenson °

## 2018-11-27 ENCOUNTER — Encounter: Payer: Self-pay | Admitting: Family Medicine

## 2018-11-27 ENCOUNTER — Other Ambulatory Visit: Payer: Self-pay

## 2018-11-27 ENCOUNTER — Ambulatory Visit: Payer: BLUE CROSS/BLUE SHIELD | Admitting: Family Medicine

## 2018-11-27 VITALS — BP 106/72 | HR 68 | Temp 97.6°F | Ht 65.0 in | Wt 153.0 lb

## 2018-11-27 DIAGNOSIS — Z87440 Personal history of urinary (tract) infections: Secondary | ICD-10-CM | POA: Diagnosis not present

## 2018-11-27 NOTE — Progress Notes (Signed)
Subjective:  Patient ID: Emma Stephenson, female    DOB: 1980/12/17, 38 y.o.   MRN: [id number]  Chief Complaint:  Follow up pyelonenephritis (finished antibiotic Monday evening)   HPI: Emma Stephenson is a 38 y.o. female presenting on 11/27/2018 for Follow up pyelonenephritis (finished antibiotic Monday evening)   1. History of UTI   Pt was seen in the ED on 11/18/2018 and treated for pyelonephritis. Pt was given IV Rocephin and sent home with Keflex. Pt states she has improved greatly since treatment. She completed the Keflex on Monday. She denies fever, chills, nausea, vomiting, flank pain, abdominal pain, weakness, or confusion. She denies dysuria, frequency, urgency, or hematuria.    Relevant past medical, surgical, family, and social history reviewed and updated as indicated.  Allergies and medications reviewed and updated.   History reviewed. No pertinent past medical history.  Past Surgical History:  Procedure Laterality Date  . BACK SURGERY      Social History   Socioeconomic History  . Marital status: Married    Spouse name: Not on file  . Number of children: Not on file  . Years of education: Not on file  . Highest education level: Not on file  Occupational History  . Not on file  Social Needs  . Financial resource strain: Not on file  . Food insecurity:    Worry: Not on file    Inability: Not on file  . Transportation needs:    Medical: Not on file    Non-medical: Not on file  Tobacco Use  . Smoking status: Never Smoker  . Smokeless tobacco: Never Used  Substance and Sexual Activity  . Alcohol use: Yes    Comment: occasional  . Drug use: Never  . Sexual activity: Not on file  Lifestyle  . Physical activity:    Days per week: Not on file    Minutes per session: Not on file  . Stress: Not on file  Relationships  . Social connections:    Talks on phone: Not on file    Gets together: Not on file    Attends religious service: Not on file    Active member  of club or organization: Not on file    Attends meetings of clubs or organizations: Not on file    Relationship status: Not on file  . Intimate partner violence:    Fear of current or ex partner: Not on file    Emotionally abused: Not on file    Physically abused: Not on file    Forced sexual activity: Not on file  Other Topics Concern  . Not on file  Social History Narrative  . Not on file    Outpatient Encounter Medications as of 11/27/2018  Medication Sig  . norethindrone-ethinyl estradiol (JUNEL FE,GILDESS FE,LOESTRIN FE) 1-20 MG-MCG tablet Take 1 tablet by mouth daily.  Marland Kitchen acetaminophen (TYLENOL) 325 MG tablet Take 325 mg by mouth every 6 (six) hours as needed.  . ondansetron (ZOFRAN ODT) 4 MG disintegrating tablet 4mg  ODT q4 hours prn nausea/vomit (Patient not taking: Reported on 11/27/2018)  . [DISCONTINUED] cephALEXin (KEFLEX) 500 MG capsule Take 1 capsule (500 mg total) by mouth 2 (two) times daily.   No facility-administered encounter medications on file as of 11/27/2018.     No Known Allergies  Review of Systems  Constitutional: Negative for activity change, appetite change, chills, diaphoresis, fatigue and fever.  Respiratory: Negative for cough and shortness of breath.   Cardiovascular: Negative for  chest pain and palpitations.  Gastrointestinal: Negative for abdominal pain, constipation, diarrhea, nausea and vomiting.  Genitourinary: Negative for decreased urine volume, difficulty urinating, dysuria, flank pain, frequency, hematuria and urgency.  Neurological: Negative for weakness and headaches.  Psychiatric/Behavioral: Negative for confusion.  All other systems reviewed and are negative.       Objective:  BP 106/72   Pulse 68   Temp 97.6 F (36.4 C) (Oral)   Ht  (1.651 m)   Wt 153 lb (69.4 kg)   LMP 11/04/2018   BMI 25.46 kg/m    Wt Readings from Last 3 Encounters:  11/27/18 153 lb (69.4 kg)  11/18/18 150 lb (68 kg)  11/16/18 150 lb (68 kg)     Physical Exam Vitals signs and nursing note reviewed.  Constitutional:      General: She is not in acute distress.    Appearance: Normal appearance. She is well-developed, well-groomed and normal weight. She is not ill-appearing or toxic-appearing.  HENT:     Head: Normocephalic and atraumatic.     Jaw: There is normal jaw occlusion.     Mouth/Throat:     Lips: Pink.     Mouth: Mucous membranes are moist.  Eyes:     General: Lids are normal.     Conjunctiva/sclera: Conjunctivae normal.     Pupils: Pupils are equal, round, and reactive to light.  Neck:     Musculoskeletal: Full passive range of motion without pain and neck supple.  Cardiovascular:     Rate and Rhythm: Normal rate and regular rhythm.     Heart sounds: Murmur present. Systolic murmur present with a grade of 2/6. No friction rub. No gallop.   Pulmonary:     Effort: Pulmonary effort is normal.     Breath sounds: Normal breath sounds.  Abdominal:     General: Abdomen is flat. Bowel sounds are normal.     Palpations: Abdomen is soft.     Tenderness: There is no abdominal tenderness. There is no right CVA tenderness or left CVA tenderness.  Skin:    General: Skin is warm and dry.     Capillary Refill: Capillary refill takes less than 2 seconds.  Neurological:     General: No focal deficit present.     Mental Status: She is alert and oriented to person, place, and time.  Psychiatric:        Mood and Affect: Mood normal.        Behavior: Behavior normal. Behavior is cooperative.        Thought Content: Thought content normal.        Judgment: Judgment normal.     Results for orders placed or performed during the hospital encounter of 11/18/18  Urine culture  Result Value Ref Range   Specimen Description      URINE, CLEAN CATCH Performed at [facility], [address], [place], [place] [address]    Special Requests      NONE Performed at [facility], [address] [place], [place] [address]    Culture  >=100,000 COLONIES/mL ESCHERICHIA COLI (A)    Report Status 11/20/2018 FINAL    Organism ID, Bacteria ESCHERICHIA COLI (A)       Susceptibility   Escherichia coli - MIC*    AMPICILLIN <=2 SENSITIVE Sensitive     CEFAZOLIN <=4 SENSITIVE Sensitive     CEFTRIAXONE <=1 SENSITIVE Sensitive     CIPROFLOXACIN <=0.25 SENSITIVE Sensitive     GENTAMICIN <=1 SENSITIVE Sensitive  IMIPENEM <=0.25 SENSITIVE Sensitive     NITROFURANTOIN <=16 SENSITIVE Sensitive     TRIMETH/SULFA <=20 SENSITIVE Sensitive     AMPICILLIN/SULBACTAM <=2 SENSITIVE Sensitive     PIP/TAZO <=4 SENSITIVE Sensitive     Extended ESBL NEGATIVE Sensitive     * >=100,000 COLONIES/mL ESCHERICHIA COLI  Urinalysis, Routine w reflex microscopic  Result Value Ref Range   Color, Urine AMBER (A) YELLOW   APPearance CLOUDY (A) CLEAR   Specific Gravity, Urine 1.020 1.005 - 1.030   pH 5.0 5.0 - 8.0   Glucose, UA NEGATIVE NEGATIVE mg/dL   Hgb urine dipstick SMALL (A) NEGATIVE   Bilirubin Urine NEGATIVE NEGATIVE   Ketones, ur 5 (A) NEGATIVE mg/dL   Protein, ur 492 (A) NEGATIVE mg/dL   Nitrite POSITIVE (A) NEGATIVE   Leukocytes,Ua MODERATE (A) NEGATIVE   RBC / HPF 21-50 0 - 5 RBC/hpf   WBC, UA >50 (H) 0 - 5 WBC/hpf   Bacteria, UA FEW (A) NONE SEEN   Squamous Epithelial / LPF 6-10 0 - 5   WBC Clumps PRESENT    Mucus PRESENT    Budding Yeast PRESENT    Hyaline Casts, UA PRESENT    Amorphous Crystal PRESENT    Non Squamous Epithelial 0-5 (A) NONE SEEN  CBC with Differential  Result Value Ref Range   WBC 8.6 4.0 - 10.5 K/uL   RBC 3.98 3.87 - 5.11 MIL/uL   Hemoglobin 11.6 (L) 12.0 - 15.0 g/dL   HCT 01.0 (L) 07.1 - 21.9 %   MCV 89.4 80.0 - 100.0 fL   MCH 29.1 26.0 - 34.0 pg   MCHC 32.6 30.0 - 36.0 g/dL   RDW 75.8 83.2 - 54.9 %   Platelets 205 150 - 400 K/uL   nRBC 0.0 0.0 - 0.2 %   Neutrophils Relative % 82 %   Neutro Abs 7.1 1.7 - 7.7 K/uL   Lymphocytes Relative 6 %   Lymphs Abs 0.6 (L) 0.7 - 4.0 K/uL   Monocytes Relative  10 %   Monocytes Absolute 0.9 0.1 - 1.0 K/uL   Eosinophils Relative 1 %   Eosinophils Absolute 0.1 0.0 - 0.5 K/uL   Basophils Relative 0 %   Basophils Absolute 0.0 0.0 - 0.1 K/uL   Immature Granulocytes 1 %   Abs Immature Granulocytes 0.04 0.00 - 0.07 K/uL  Comprehensive metabolic panel  Result Value Ref Range   Sodium 136 135 - 145 mmol/L   Potassium 3.4 (L) 3.5 - 5.1 mmol/L   Chloride 104 98 - 111 mmol/L   CO2 22 22 - 32 mmol/L   Glucose, Bld 118 (H) 70 - 99 mg/dL   BUN 11 6 - 20 mg/dL   Creatinine, Ser 8.26 0.44 - 1.00 mg/dL   Calcium 8.3 (L) 8.9 - 10.3 mg/dL   Total Protein 6.5 6.5 - 8.1 g/dL   Albumin 3.1 (L) 3.5 - 5.0 g/dL   AST 36 15 - 41 U/L   ALT 43 0 - 44 U/L   Alkaline Phosphatase 95 38 - 126 U/L   Total Bilirubin 1.1 0.3 - 1.2 mg/dL   GFR calc non Af Amer >60 >60 mL/min   GFR calc Af Amer >60 >60 mL/min   Anion gap 10 5 - 15  Lipase, blood  Result Value Ref Range   Lipase 20 11 - 51 U/L  Pregnancy, urine  Result Value Ref Range   Preg Test, Ur NEGATIVE NEGATIVE     Urinalysis in office: 2+  blood, negative nitrites, trace leukocytes  Pertinent labs & imaging results that were available during my care of the patient were reviewed by me and considered in my medical decision making.  Assessment & Plan:  Ledia was seen today for follow up pyelonenephritis.  Diagnoses and all orders for this visit:  History of UTI Urinalysis in office: 2+ blood, negative nitrites, trace leukocytes. Culture pending and will treat if warranted. Avoid bladder irritants. Continue increased water intake. Report any new or worsening symptoms.  -     Urine Culture -     Urinalysis, Complete     Continue all other maintenance medications.  Follow up plan: Return in about 4 weeks (around 12/25/2018), or if symptoms worsen or fail to improve, for Urine.    The above assessment and management plan was discussed with the patient. The patient verbalized understanding of and has agreed  to the management plan. Patient is aware to call the clinic if symptoms persist or worsen. Patient is aware when to return to the clinic for a follow-up visit. Patient educated on when it is appropriate to go to the emergency department.   Kari Baars, FNP-C [facility] [phone]

## 2018-11-28 LAB — URINE CULTURE

## 2018-12-02 ENCOUNTER — Telehealth: Payer: Self-pay | Admitting: Family Medicine

## 2018-12-02 LAB — URINALYSIS, COMPLETE
Bilirubin, UA: NEGATIVE
Glucose, UA: NEGATIVE
Ketones, UA: NEGATIVE
Nitrite, UA: NEGATIVE
PH UA: 6.5 (ref 5.0–7.5)
PROTEIN UA: NEGATIVE
Specific Gravity, UA: 1.02 (ref 1.005–1.030)
Urobilinogen, Ur: 0.2 mg/dL (ref 0.2–1.0)

## 2018-12-02 LAB — MICROSCOPIC EXAMINATION: Renal Epithel, UA: NONE SEEN /hpf

## 2018-12-02 NOTE — Telephone Encounter (Signed)
Aware of negative urine culture results and states she is having lower left back pain and would like another antibiotic

## 2018-12-02 NOTE — Telephone Encounter (Signed)
Patient aware.

## 2018-12-02 NOTE — Telephone Encounter (Signed)
No indication of a urinary or kidney infection due to no growth of bacteria. Increase water intake and avoid bladder irritants such as caffeine. Can try cranberry juice or motrin for the discomfort.

## 2018-12-03 ENCOUNTER — Ambulatory Visit: Payer: Self-pay | Admitting: Physician Assistant

## 2018-12-26 ENCOUNTER — Ambulatory Visit: Payer: BLUE CROSS/BLUE SHIELD | Admitting: Family Medicine

## 2019-03-17 IMAGING — US ULTRASOUND ABDOMEN LIMITED
1 series · 14 of 25 positions shown · non-contrast
Comparison: 12/15/2008 CT.

CLINICAL DATA: 37-year-old female with upper abdominal pain for 5
days. Initial encounter.

EXAM:
ULTRASOUND ABDOMEN LIMITED RIGHT UPPER QUADRANT

[Series 1: ultrasound abdomen limited · 0.19mm/px · 14 of 120 slices shown]
[im 1/120]
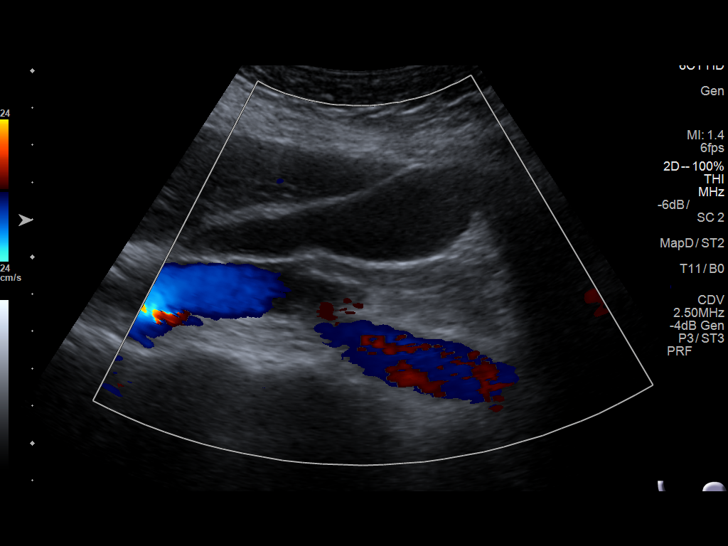
[im 10/120]
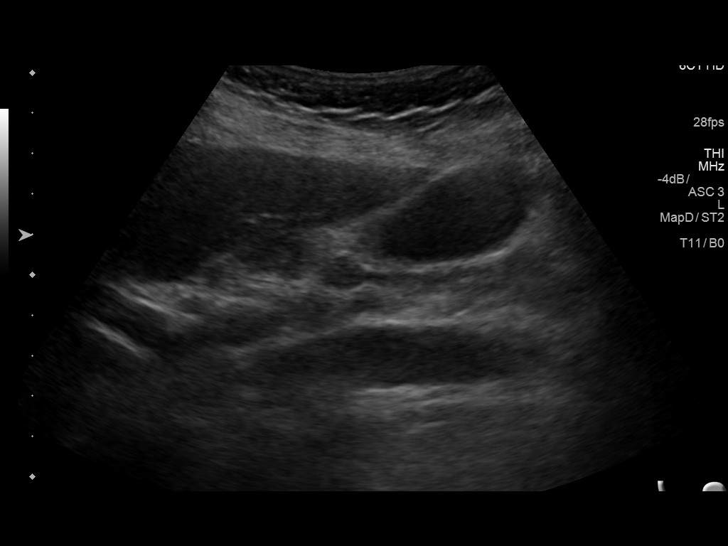
[im 20/120]
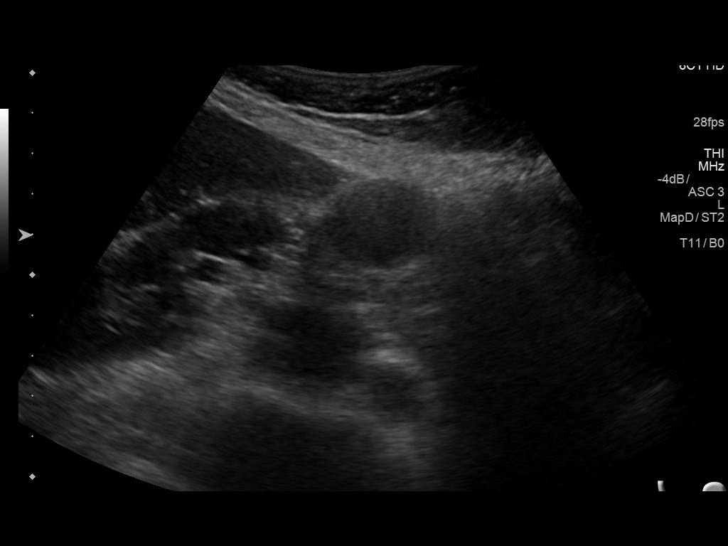
[im 30/120]
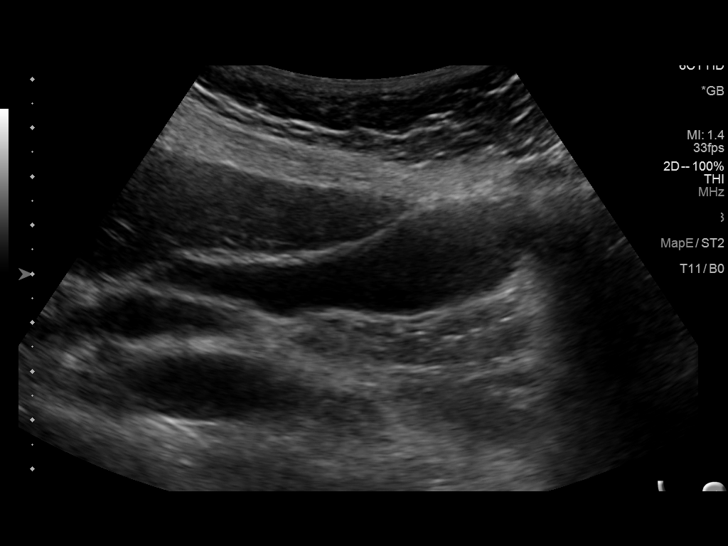
[im 40/120]
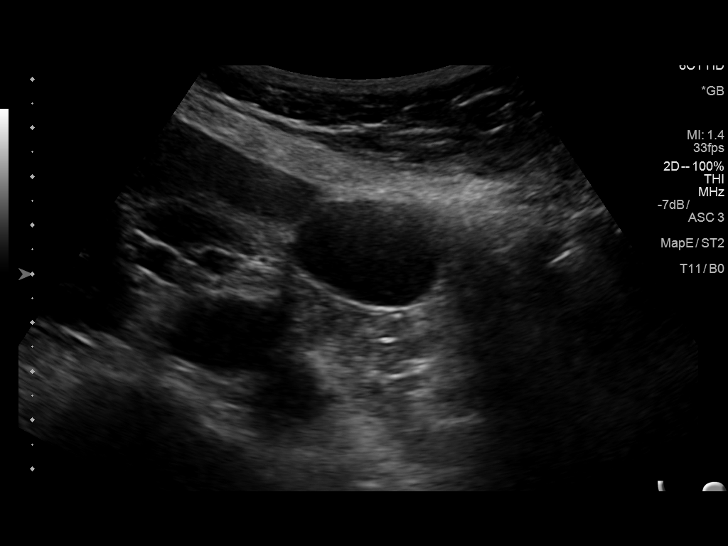
[im 45/120]
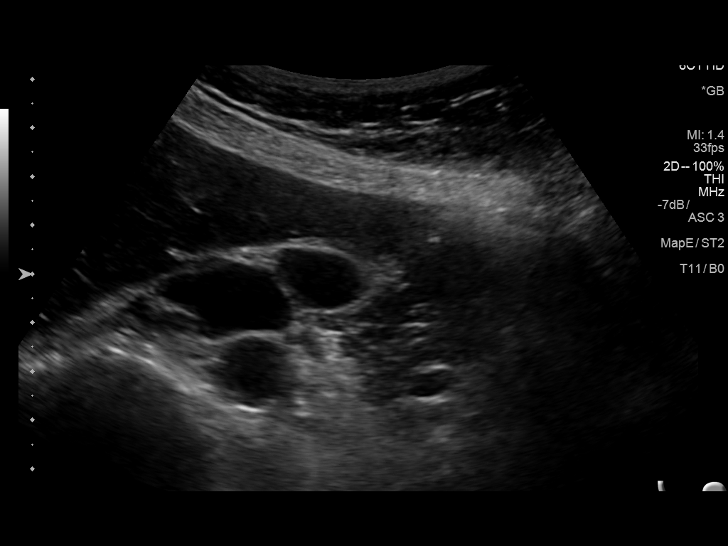
[im 55/120]
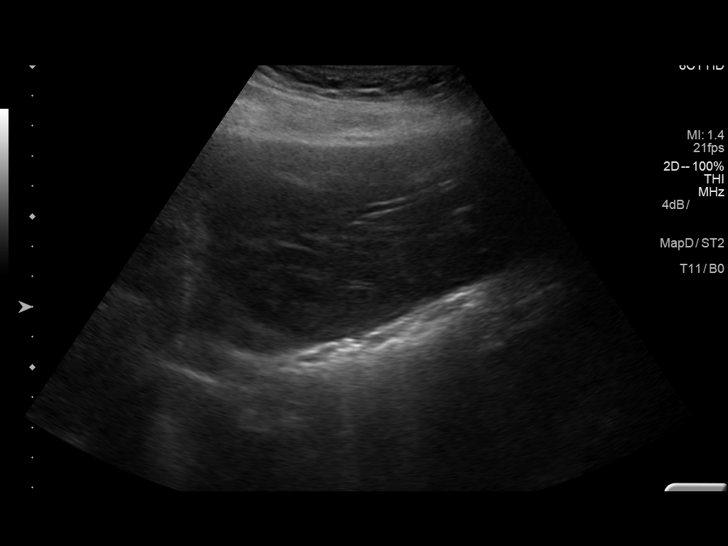
[im 65/120]
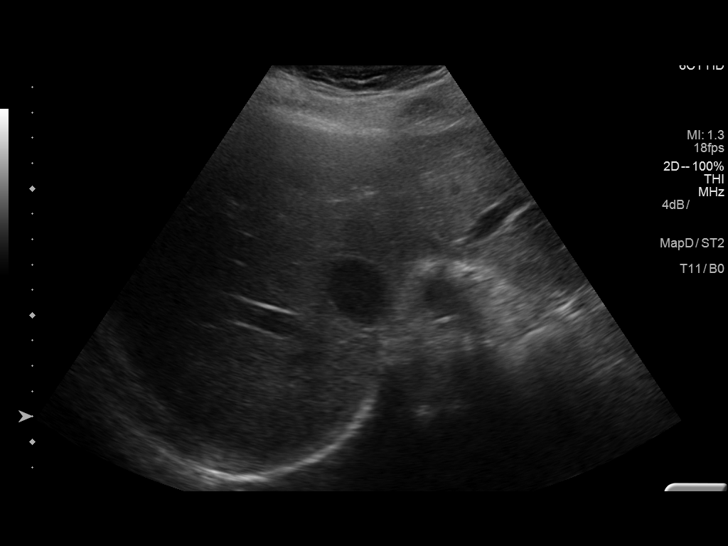
[im 75/120]
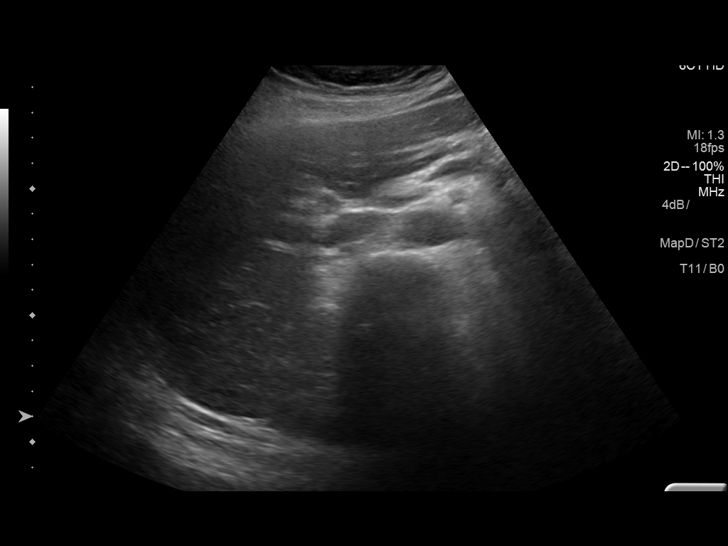
[im 80/120]
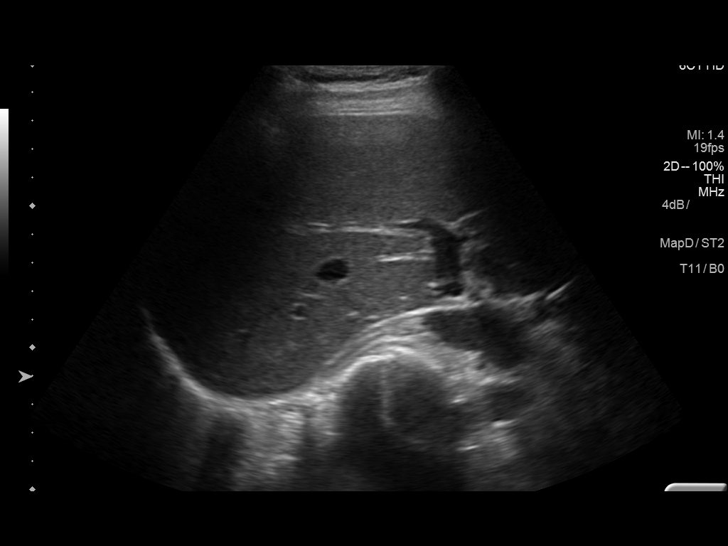
[im 90/120]
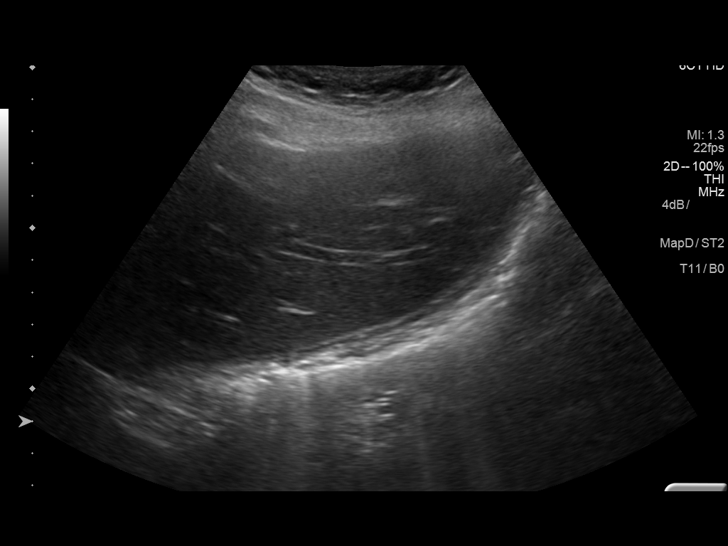
[im 100/120]
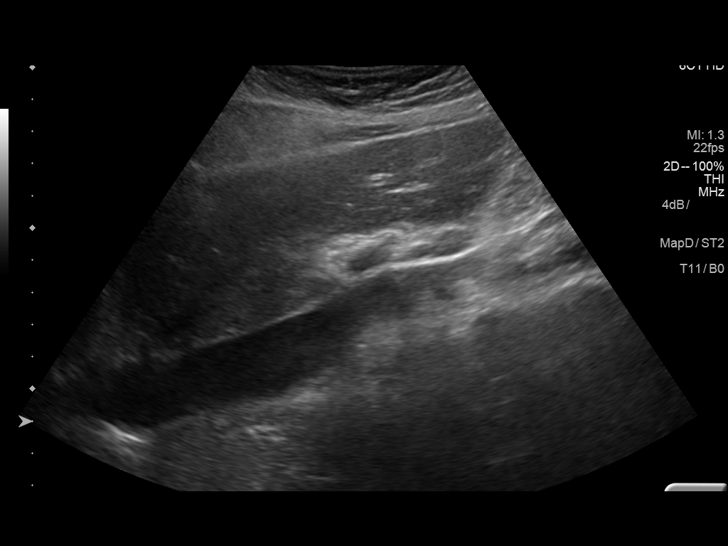
[im 110/120]
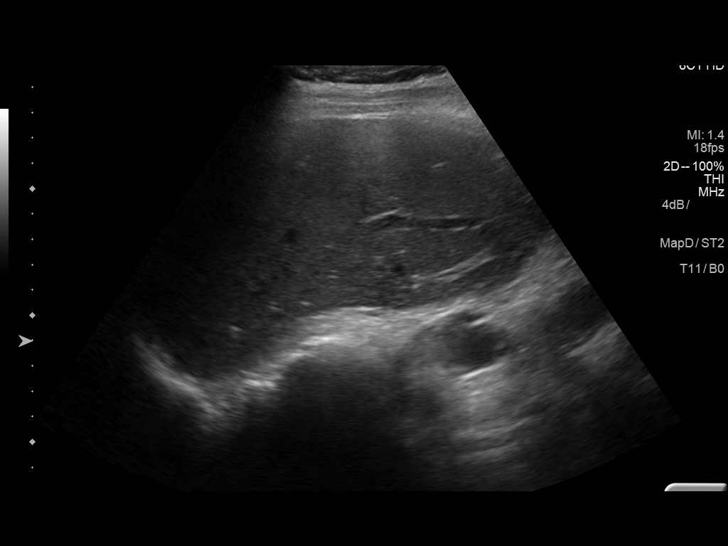
[im 120/120]
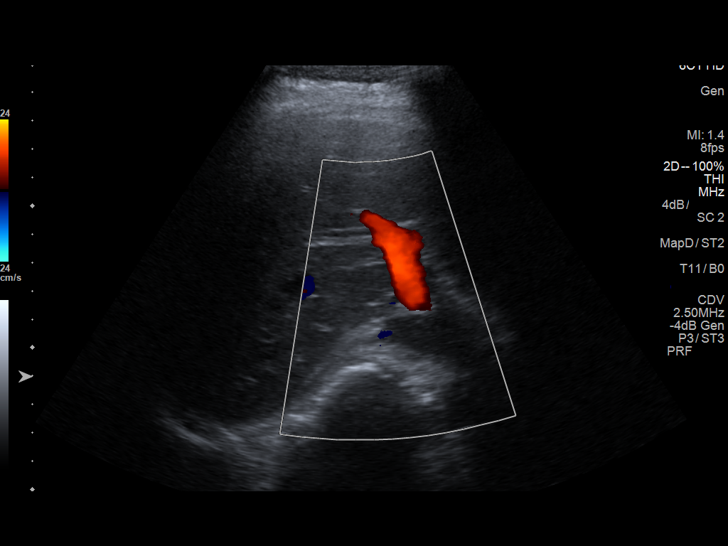

[14 of 25 positions shown; findings below may reference images not displayed]

FINDINGS: Gallbladder:

No gallstones or wall thickening visualized. No sonographic Murphy
sign noted by sonographer.

Common bile duct:

Diameter:

Liver:

No focal lesion identified. Within normal limits in parenchymal
echogenicity. Portal vein is patent on color Doppler imaging with
normal direction of blood flow towards the liver.
IMPRESSION: Negative right upper quadrant ultrasound.

## 2019-06-19 ENCOUNTER — Ambulatory Visit (INDEPENDENT_AMBULATORY_CARE_PROVIDER_SITE_OTHER): Payer: BC Managed Care – PPO | Admitting: Family Medicine

## 2019-06-19 ENCOUNTER — Encounter: Payer: Self-pay | Admitting: Family Medicine

## 2019-06-19 DIAGNOSIS — N3090 Cystitis, unspecified without hematuria: Secondary | ICD-10-CM

## 2019-06-19 MED ORDER — CIPROFLOXACIN HCL 500 MG PO TABS: 500.0000 mg | ORAL_TABLET | Freq: Two times a day (BID) | ORAL | 0 refills | Status: AC

## 2019-06-19 NOTE — Progress Notes (Signed)
Virtual Visit via telephone Note Due to COVID-19 pandemic this visit was conducted virtually. This visit type was conducted due to national recommendations for restrictions regarding the COVID-19 Pandemic (e.g. social distancing, sheltering in place) in an effort to limit this patient's exposure and mitigate transmission in our community. All issues noted in this document were discussed and addressed.  A physical exam was not performed with this format.   I connected with Emma Stephenson on 06/19/19 at 1000 by telephone and verified that I am speaking with the correct person using two identifiers. Emma Stephenson is currently located at vacation home and family is currently with them during visit. The provider, Kari Baars, FNP is located in their office at time of visit.  I discussed the limitations, risks, security and privacy concerns of performing an evaluation and management service by telephone and the availability of in person appointments. I also discussed with the patient that there may be a patient responsible charge related to this service. The patient expressed understanding and agreed to proceed.  Subjective:  Patient ID: Emma Stephenson, female    DOB: [birth year], 38 y.o.   MRN: [id number]  Chief Complaint:  Dysuria   HPI: Emma Stephenson is a 38 y.o. female presenting on 06/19/2019 for Dysuria   Pt reports she is on vacation and developed dysuria several days ago with dark colored urine. States yesterday she developed right lower back aching. States she is concerned because she has had pyelonephritis in the past. She denies fever, chills, weakness, confusion, hematuria, or fatigue. No anuria or decreased urination.  Dysuria  This is a new problem. The current episode started in the past 7 days. The problem occurs every urination. The problem has been gradually worsening. The quality of the pain is described as aching and burning. The pain is at a severity of 4/10. The pain is mild. There has  been no fever. She is not sexually active. There is a history of pyelonephritis. Associated symptoms include flank pain, frequency, hesitancy and urgency. Pertinent negatives include no chills, discharge, hematuria, nausea, possible pregnancy, sweats or vomiting. She has tried increased fluids for the symptoms. The treatment provided no relief.     Relevant past medical, surgical, family, and social history reviewed and updated as indicated.  Allergies and medications reviewed and updated.   History reviewed. No pertinent past medical history.  Past Surgical History:  Procedure Laterality Date   BACK SURGERY      Social History   Socioeconomic History   Marital status: Married    Spouse name: Not on file   Number of children: Not on file   Years of education: Not on file   Highest education level: Not on file  Occupational History   Not on file  Social Needs   Financial resource strain: Not on file   Food insecurity    Worry: Not on file    Inability: Not on file   Transportation needs    Medical: Not on file    Non-medical: Not on file  Tobacco Use   Smoking status: Never Smoker   Smokeless tobacco: Never Used  Substance and Sexual Activity   Alcohol use: Yes    Comment: occasional   Drug use: Never   Sexual activity: Not on file  Lifestyle   Physical activity    Days per week: Not on file    Minutes per session: Not on file   Stress: Not on file  Relationships  Social Musicianconnections    Talks on phone: Not on file    Gets together: Not on file    Attends religious service: Not on file    Active member of club or organization: Not on file    Attends meetings of clubs or organizations: Not on file    Relationship status: Not on file   Intimate partner violence    Fear of current or ex partner: Not on file    Emotionally abused: Not on file    Physically abused: Not on file    Forced sexual activity: Not on file  Other Topics Concern   Not on  file  Social History Narrative   Not on file    Outpatient Encounter Medications as of 06/19/2019  Medication Sig   acetaminophen (TYLENOL) 325 MG tablet Take 325 mg by mouth every 6 (six) hours as needed.   ciprofloxacin (CIPRO) 500 MG tablet Take 1 tablet (500 mg total) by mouth 2 (two) times daily for 7 days.   norethindrone-ethinyl estradiol (JUNEL FE,GILDESS FE,LOESTRIN FE) 1-20 MG-MCG tablet Take 1 tablet by mouth daily.   ondansetron (ZOFRAN ODT) 4 MG disintegrating tablet 4mg  ODT q4 hours prn nausea/vomit (Patient not taking: Reported on 11/27/2018)   No facility-administered encounter medications on file as of 06/19/2019.     No Known Allergies  Review of Systems  Constitutional: Negative for activity change, appetite change, chills, diaphoresis, fatigue, fever and unexpected weight change.  HENT: Negative.   Eyes: Negative.  Negative for photophobia and visual disturbance.  Respiratory: Negative for cough, chest tightness and shortness of breath.   Cardiovascular: Negative for chest pain, palpitations and leg swelling.  Gastrointestinal: Negative for abdominal distention, abdominal pain, anal bleeding, blood in stool, constipation, diarrhea, nausea, rectal pain and vomiting.  Endocrine: Negative.   Genitourinary: Positive for dysuria, flank pain, frequency, hesitancy and urgency. Negative for decreased urine volume, difficulty urinating, genital sores, hematuria, menstrual problem, pelvic pain, vaginal bleeding, vaginal discharge and vaginal pain.  Musculoskeletal: Negative for arthralgias and myalgias.  Skin: Negative.   Allergic/Immunologic: Negative.   Neurological: Negative for dizziness, tremors, weakness, light-headedness and headaches.  Hematological: Negative.   Psychiatric/Behavioral: Negative for confusion, hallucinations, sleep disturbance and suicidal ideas.  All other systems reviewed and are negative.        Observations/Objective: No vital signs or  physical exam, this was a telephone or virtual health encounter.  Pt alert and oriented, answers all questions appropriately, and able to speak in full sentences.    Assessment and Plan: Emma Stephenson was seen today for dysuria.  Diagnoses and all orders for this visit:  Cystitis Pt is away on vacation and reports dysuria accompanied by frequency, urgency, dark urine, and right flank pain. Pt has a history of pyelonephritis. Will initiate Cipro 500 mg twice daily for 7 days. Pt aware to increase water intake and to report any new, worsening, or persistent symptoms. Medications as prescribed.  -     ciprofloxacin (CIPRO) 500 MG tablet; Take 1 tablet (500 mg total) by mouth 2 (two) times daily for 7 days.     Follow Up Instructions: Return if symptoms worsen or fail to improve.    I discussed the assessment and treatment plan with the patient. The patient was provided an opportunity to ask questions and all were answered. The patient agreed with the plan and demonstrated an understanding of the instructions.   The patient was advised to call back or seek an in-person evaluation if the symptoms worsen  or if the condition fails to improve as anticipated.  The above assessment and management plan was discussed with the patient. The patient verbalized understanding of and has agreed to the management plan. Patient is aware to call the clinic if they develop any new symptoms or if symptoms persist or worsen. Patient is aware when to return to the clinic for a follow-up visit. Patient educated on when it is appropriate to go to the emergency department.    I provided 15 minutes of non-face-to-face time during this encounter. The call started at 1000. The call ended at 1015. The other time was used for coordination of care.    Monia Pouch, FNP-C [facility] [address] [place], [place] [address] [phone] 06/19/19

## 2020-05-10 ENCOUNTER — Ambulatory Visit (INDEPENDENT_AMBULATORY_CARE_PROVIDER_SITE_OTHER): Payer: BC Managed Care – PPO | Admitting: Family

## 2020-05-10 ENCOUNTER — Encounter: Payer: Self-pay | Admitting: Family

## 2020-05-10 DIAGNOSIS — Z20822 Contact with and (suspected) exposure to covid-19: Secondary | ICD-10-CM | POA: Diagnosis not present

## 2020-05-10 DIAGNOSIS — J019 Acute sinusitis, unspecified: Secondary | ICD-10-CM

## 2020-05-10 MED ORDER — AMOXICILLIN-POT CLAVULANATE 875-125 MG PO TABS: 1.0000 | ORAL_TABLET | Freq: Two times a day (BID) | ORAL | 0 refills | Status: DC

## 2020-05-10 NOTE — Progress Notes (Signed)
Virtual Visit via telephone Note Due to COVID-19 pandemic this visit was conducted virtually. This visit type was conducted due to national recommendations for restrictions regarding the COVID-19 Pandemic (e.g. social distancing, sheltering in place) in an effort to limit this patient's exposure and mitigate transmission in our community. All issues noted in this document were discussed and addressed.  A physical exam was not performed with this format.  I connected with Emma Stephenson on 05/10/20 at 11:19 AM  by telephone and verified that I am speaking with the correct person using two identifiers. Emma Stephenson is currently located at home and no one is currently with her during visit. The provider, Jannifer Rodney, FNP is located in their office at time of visit.  I discussed the limitations, risks, security and privacy concerns of performing an evaluation and management service by telephone and the availability of in person appointments. I also discussed with the patient that there may be a patient responsible charge related to this service. The patient expressed understanding and agreed to proceed.   History and Present Illness:  Pt presents to the office today with sinus symptoms. She has been COVID vaccinated and is scheduled for COVID test this evening.  Sinusitis This is a new problem. The current episode started in the past 7 days. The problem has been waxing and waning since onset. There has been no fever. Her pain is at a severity of 7/10. The pain is mild. Associated symptoms include congestion, ear pain, headaches, a hoarse voice, sinus pressure and sneezing. Pertinent negatives include no chills, coughing, shortness of breath or sore throat. Past treatments include oral decongestants. The treatment provided mild relief.      Review of Systems  Constitutional: Negative for chills.  HENT: Positive for congestion, ear pain, hoarse voice, sinus pressure and sneezing. Negative for sore  throat.   Respiratory: Negative for cough and shortness of breath.   Neurological: Positive for headaches.  All other systems reviewed and are negative.    Observations/Objective: No SOB or distress noted noted   Assessment and Plan: Emma Stephenson comes in today with chief complaint of No chief complaint on file.   Diagnosis and orders addressed:  1. Acute non-recurrent sinusitis, unspecified location Pt will continue with OTC medication and if symptoms worsen or do not improve by Wednesday night Thursday morning, she will start Augmentin Keep COVID testing appointment  - Take meds as prescribed - Use a cool mist humidifier  -Use saline nose sprays frequently -Force fluids -For any cough or congestion  Use plain Mucinex- regular strength or max strength is fine -For fever or aces or pains- take tylenol or ibuprofen. -Throat lozenges if help Call if symptoms worsen or do not improve  - amoxicillin-clavulanate (AUGMENTIN) 875-125 MG tablet; Take 1 tablet by mouth 2 (two) times daily.  Dispense: 14 tablet; Refill: 0      I discussed the assessment and treatment plan with the patient. The patient was provided an opportunity to ask questions and all were answered. The patient agreed with the plan and demonstrated an understanding of the instructions.   The patient was advised to call back or seek an in-person evaluation if the symptoms worsen or if the condition fails to improve as anticipated.  The above assessment and management plan was discussed with the patient. The patient verbalized understanding of and has agreed to the management plan. Patient is aware to call the clinic if symptoms persist or worsen. Patient is aware  when to return to the clinic for a follow-up visit. Patient educated on when it is appropriate to go to the emergency department.   Time call ended:  11:29 AM   I provided 10 minutes of non-face-to-face time during this encounter.    Jannifer Rodney,  FNP

## 2020-07-20 ENCOUNTER — Telehealth: Payer: Self-pay

## 2020-07-20 DIAGNOSIS — M7582 Other shoulder lesions, left shoulder: Secondary | ICD-10-CM | POA: Diagnosis not present

## 2020-07-20 NOTE — Telephone Encounter (Signed)
Any redness, swelling, fever? It is common to have a limited reaction with soreness and slight redness. She should still be able to move her able, but could be slightly tender.

## 2020-07-20 NOTE — Telephone Encounter (Signed)
Pt got her covid booster moderna on Sunday. She cannot move her arm. It feels like it is locked. She wants a televisit but no visit today. She wants at least nurse advice. Please call back

## 2020-07-20 NOTE — Telephone Encounter (Signed)
Waiting on provider to advise

## 2020-07-20 NOTE — Telephone Encounter (Signed)
Patient states that she has now been seen at Urgent Care.  Provider informed patient that it looked like vaccine was given at the tip of deltoid muscle and that the inflammation has settled in rotator cuff. Patient was given and steroid and advised to follow up with pcp in 7-10 days if no better

## 2020-07-29 DIAGNOSIS — M545 Low back pain, unspecified: Secondary | ICD-10-CM | POA: Diagnosis not present

## 2020-07-29 DIAGNOSIS — Z01419 Encounter for gynecological examination (general) (routine) without abnormal findings: Secondary | ICD-10-CM | POA: Diagnosis not present

## 2020-07-29 DIAGNOSIS — Z124 Encounter for screening for malignant neoplasm of cervix: Secondary | ICD-10-CM | POA: Diagnosis not present

## 2020-07-29 DIAGNOSIS — Z6825 Body mass index (BMI) 25.0-25.9, adult: Secondary | ICD-10-CM | POA: Diagnosis not present

## 2020-08-10 DIAGNOSIS — Z20822 Contact with and (suspected) exposure to covid-19: Secondary | ICD-10-CM | POA: Diagnosis not present

## 2020-09-22 DIAGNOSIS — Z1231 Encounter for screening mammogram for malignant neoplasm of breast: Secondary | ICD-10-CM | POA: Diagnosis not present

## 2021-06-21 ENCOUNTER — Ambulatory Visit: Payer: BC Managed Care – PPO | Admitting: Family Medicine

## 2021-06-21 ENCOUNTER — Encounter: Payer: Self-pay | Admitting: Family Medicine

## 2021-06-21 DIAGNOSIS — J019 Acute sinusitis, unspecified: Secondary | ICD-10-CM

## 2021-06-21 MED ORDER — METHYLPREDNISOLONE 4 MG PO TBPK: ORAL_TABLET | ORAL | 0 refills | Status: DC

## 2021-06-21 MED ORDER — AMOXICILLIN-POT CLAVULANATE 875-125 MG PO TABS: 1.0000 | ORAL_TABLET | Freq: Two times a day (BID) | ORAL | 0 refills | Status: AC

## 2021-06-21 NOTE — Progress Notes (Signed)
   Virtual Visit via Telephone Note  I connected with Emma Stephenson on 06/21/21 at 8:08 AM by telephone and verified that I am speaking with the correct person using two identifiers. Emma Stephenson is currently located at [facility] and nobody is currently with her during this visit. The provider, Gwenlyn Fudge, FNP is located in their office at time of visit.  I discussed the limitations, risks, security and privacy concerns of performing an evaluation and management service by telephone and the availability of in person appointments. I also discussed with the patient that there may be a patient responsible charge related to this service. The patient expressed understanding and agreed to proceed.  Subjective: PCP: Junie Spencer, FNP  Chief Complaint  Patient presents with   URI   Patient complains of cough, head/chest congestion, headache, runny nose, sneezing, sore throat, ear pain/pressure, facial pain/pressure, and postnasal drainage. Onset of symptoms was 5 days ago, gradually worsening since that time. She is drinking plenty of fluids. Evaluation to date: none. Treatment to date: decongestants and Tylenol . She does not smoke.    ROS: Per HPI  Current Outpatient Medications:    acetaminophen (TYLENOL) 325 MG tablet, Take 325 mg by mouth every 6 (six) hours as needed., Disp: , Rfl:    norethindrone-ethinyl estradiol (JUNEL FE,GILDESS FE,LOESTRIN FE) 1-20 MG-MCG tablet, Take 1 tablet by mouth daily., Disp: , Rfl:   No Known Allergies History reviewed. No pertinent past medical history.  Observations/Objective: A&O  No respiratory distress or wheezing audible over the phone Mood, judgement, and thought processes all WNL  Assessment and Plan: 1. Acute non-recurrent sinusitis, unspecified location - amoxicillin-clavulanate (AUGMENTIN) 875-125 MG tablet; Take 1 tablet by mouth 2 (two) times daily for 7 days.  Dispense: 14 tablet; Refill: 0 - methylPREDNISolone (MEDROL DOSEPAK) 4 MG  TBPK tablet; Use as directed  Dispense: 21 each; Refill: 0   Follow Up Instructions:  I discussed the assessment and treatment plan with the patient. The patient was provided an opportunity to ask questions and all were answered. The patient agreed with the plan and demonstrated an understanding of the instructions.   The patient was advised to call back or seek an in-person evaluation if the symptoms worsen or if the condition fails to improve as anticipated.  The above assessment and management plan was discussed with the patient. The patient verbalized understanding of and has agreed to the management plan. Patient is aware to call the clinic if symptoms persist or worsen. Patient is aware when to return to the clinic for a follow-up visit. Patient educated on when it is appropriate to go to the emergency department.   Time call ended: 8:19 AM  I provided 11 minutes of non-face-to-face time during this encounter.  Deliah Boston, MSN, APRN, FNP-C [facility] 06/21/21

## 2021-09-29 DIAGNOSIS — Z1231 Encounter for screening mammogram for malignant neoplasm of breast: Secondary | ICD-10-CM | POA: Diagnosis not present

## 2021-09-29 DIAGNOSIS — Z01419 Encounter for gynecological examination (general) (routine) without abnormal findings: Secondary | ICD-10-CM | POA: Diagnosis not present

## 2021-09-29 DIAGNOSIS — Z124 Encounter for screening for malignant neoplasm of cervix: Secondary | ICD-10-CM | POA: Diagnosis not present

## 2021-09-29 DIAGNOSIS — Z6826 Body mass index (BMI) 26.0-26.9, adult: Secondary | ICD-10-CM | POA: Diagnosis not present

## 2022-02-08 DIAGNOSIS — M25511 Pain in right shoulder: Secondary | ICD-10-CM | POA: Diagnosis not present

## 2022-03-23 DIAGNOSIS — M25511 Pain in right shoulder: Secondary | ICD-10-CM | POA: Diagnosis not present

## 2022-03-29 DIAGNOSIS — M25511 Pain in right shoulder: Secondary | ICD-10-CM | POA: Diagnosis not present

## 2022-04-11 DIAGNOSIS — M25511 Pain in right shoulder: Secondary | ICD-10-CM | POA: Diagnosis not present

## 2022-04-19 DIAGNOSIS — M25511 Pain in right shoulder: Secondary | ICD-10-CM | POA: Diagnosis not present

## 2022-04-20 DIAGNOSIS — M25511 Pain in right shoulder: Secondary | ICD-10-CM | POA: Diagnosis not present

## 2022-04-25 DIAGNOSIS — M25511 Pain in right shoulder: Secondary | ICD-10-CM | POA: Diagnosis not present

## 2022-05-04 DIAGNOSIS — M25511 Pain in right shoulder: Secondary | ICD-10-CM | POA: Diagnosis not present

## 2022-05-11 DIAGNOSIS — M25511 Pain in right shoulder: Secondary | ICD-10-CM | POA: Diagnosis not present

## 2022-05-19 DIAGNOSIS — M25511 Pain in right shoulder: Secondary | ICD-10-CM | POA: Diagnosis not present

## 2022-05-23 DIAGNOSIS — M25511 Pain in right shoulder: Secondary | ICD-10-CM | POA: Diagnosis not present

## 2022-05-30 DIAGNOSIS — M25511 Pain in right shoulder: Secondary | ICD-10-CM | POA: Diagnosis not present

## 2022-06-15 DIAGNOSIS — M25511 Pain in right shoulder: Secondary | ICD-10-CM | POA: Diagnosis not present

## 2022-10-23 ENCOUNTER — Encounter: Payer: Self-pay | Admitting: Nurse Practitioner

## 2022-10-23 ENCOUNTER — Telehealth (INDEPENDENT_AMBULATORY_CARE_PROVIDER_SITE_OTHER): Payer: No Typology Code available for payment source | Admitting: Nurse Practitioner

## 2022-10-23 DIAGNOSIS — R051 Acute cough: Secondary | ICD-10-CM

## 2022-10-23 DIAGNOSIS — J040 Acute laryngitis: Secondary | ICD-10-CM | POA: Diagnosis not present

## 2022-10-23 MED ORDER — FLUTICASONE PROPIONATE 50 MCG/ACT NA SUSP: 2.0000 | Freq: Every day | NASAL | 6 refills | Status: DC

## 2022-10-23 NOTE — Progress Notes (Signed)
Virtual Visit Consent   Emma Stephenson, you are scheduled for a virtual visit with Mary-Margaret Hassell Done, [place], a [facility] provider, today.     Just as with appointments in the office, your consent must be obtained to participate.  Your consent will be active for this visit and any virtual visit you may have with one of our providers in the next 365 days.     If you have a MyChart account, a copy of this consent can be sent to you electronically.  All virtual visits are billed to your insurance company just like a traditional visit in the office.    As this is a virtual visit, video technology does not allow for your provider to perform a traditional examination.  This may limit your provider's ability to fully assess your condition.  If your provider identifies any concerns that need to be evaluated in person or the need to arrange testing (such as labs, EKG, etc.), we will make arrangements to do so.     Although advances in technology are sophisticated, we cannot ensure that it will always work on either your end or our end.  If the connection with a video visit is poor, the visit may have to be switched to a telephone visit.  With either a video or telephone visit, we are not always able to ensure that we have a secure connection.     I need to obtain your verbal consent now.   Are you willing to proceed with your visit today? YES   Emma Stephenson has provided verbal consent on 10/23/2022 for a virtual visit (video or telephone).   Mary-Margaret Hassell Done, FNP   Date: 10/23/2022 8:42 AM   Virtual Visit via Video Note   I, Mary-Margaret Hassell Done, connected with Emma Stephenson ([id number], 1981-01-18) on 10/23/22 at  4:30 PM EST by a video-enabled telemedicine application and verified that I am speaking with the correct person using two identifiers.  Location: Patient: Virtual Visit Location Patient: Home Provider: Virtual Visit Location Provider: Mobile   I discussed the limitations of evaluation  and management by telemedicine and the availability of in person appointments. The patient expressed understanding and agreed to proceed.    History of Present Illness: Emma Stephenson is a 42 y.o. who identifies as a female who was assigned female at birth, and is being seen today for laryngitis   HPI: Patient says on Friday she started losing her voice. Saturday she could not talk at all. Now she has a slight voice and has a cough. Feels  like she has a "plug in her chest.    Review of Systems  Constitutional:  Positive for malaise/fatigue. Negative for chills and fever.  Respiratory:  Positive for cough. Negative for sputum production and shortness of breath.   Neurological:  Positive for dizziness. Negative for headaches.    Problems: There are no problems to display for this patient.   Allergies: No Known Allergies Medications:  Current Outpatient Medications:    acetaminophen (TYLENOL) 325 MG tablet, Take 325 mg by mouth every 6 (six) hours as needed., Disp: , Rfl:    methylPREDNISolone (MEDROL DOSEPAK) 4 MG TBPK tablet, Use as directed, Disp: 21 each, Rfl: 0   norethindrone-ethinyl estradiol (JUNEL FE,GILDESS FE,LOESTRIN FE) 1-20 MG-MCG tablet, Take 1 tablet by mouth daily., Disp: , Rfl:   Observations/Objective: Patient is well-developed, well-nourished in no acute distress.  Resting comfortably  at home.  Head is normocephalic, atraumatic.  No  labored breathing.  Speech is clear and coherent with logical content.  Patient is alert and oriented at baseline.    Assessment and Plan:  Emma Stephenson in today with chief complaint of No chief complaint on file.   1. Laryngitis 2. Acute cough 1. Take meds as prescribed 2. Use a cool mist humidifier especially during the winter months and when heat has been humid. 3. Use saline nose sprays frequently 4. Saline irrigations of the nose can be very helpful if done frequently.  * 4X daily for 1 week*  * Use of a nettie pot can be  helpful with this. Follow directions with this* 5. Drink plenty of fluids 6. Keep thermostat turn down low 7.For any cough or congestion- mucinex 8. For fever or aces or pains- take tylenol or ibuprofen appropriate for age and weight.  * for fevers greater than 101 orally you may alternate ibuprofen and tylenol every  3 hours.    Do home covid test and if it is positive call back and let me know.   The above assessment and management plan was discussed with the patient. The patient verbalized understanding of and has agreed to the management plan. Patient is aware to call the clinic if symptoms persist or worsen. Patient is aware when to return to the clinic for a follow-up visit. Patient educated on when it is appropriate to go to the emergency department.   Mary-Margaret Hassell Done, FNP    Follow Up Instructions: I discussed the assessment and treatment plan with the patient. The patient was provided an opportunity to ask questions and all were answered. The patient agreed with the plan and demonstrated an understanding of the instructions.  A copy of instructions were sent to the patient via MyChart.  The patient was advised to call back or seek an in-person evaluation if the symptoms worsen or if the condition fails to improve as anticipated.  Time:  I spent 9 minutes with the patient via telehealth technology discussing the above problems/concerns.    Mary-Margaret Hassell Done, FNP

## 2022-10-23 NOTE — Patient Instructions (Signed)
  Marilynne Drivers, thank you for joining Chevis Pretty, FNP for today's virtual visit.  While this provider is not your primary care provider (PCP), if your PCP is located in our provider database this encounter information will be shared with them immediately following your visit.   [facility] account gives you access to today's visit and all your visits, tests, and labs performed at [facility] " click here if you don't have a [facility] account or go to [email or web address]  Consent: (Patient) Emma Stephenson provided verbal consent for this virtual visit at the beginning of the encounter.  Current Medications:  Current Outpatient Medications:    fluticasone (FLONASE) 50 MCG/ACT nasal spray, Place 2 sprays into both nostrils daily., Disp: 16 g, Rfl: 6   acetaminophen (TYLENOL) 325 MG tablet, Take 325 mg by mouth every 6 (six) hours as needed., Disp: , Rfl:    norethindrone-ethinyl estradiol (JUNEL FE,GILDESS FE,LOESTRIN FE) 1-20 MG-MCG tablet, Take 1 tablet by mouth daily., Disp: , Rfl:    Medications ordered in this encounter:  Meds ordered this encounter  Medications   fluticasone (FLONASE) 50 MCG/ACT nasal spray    Sig: Place 2 sprays into both nostrils daily.    Dispense:  16 g    Refill:  6    Order Specific Question:   Supervising Provider    Answer:   Caryl Pina A [[id number]]     *If you need refills on other medications prior to your next appointment, please contact your pharmacy*  Follow-Up: Call back or seek an in-person evaluation if the symptoms worsen or if the condition fails to improve as anticipated.  [place]  Other Instructions 1. Take meds as prescribed 2. Use a cool mist humidifier especially during the winter months and when heat has been humid. 3. Use saline nose sprays frequently 4. Saline irrigations of the nose can be very helpful if done frequently.  * 4X daily for 1 week*  * Use  of a nettie pot can be helpful with this. Follow directions with this* 5. Drink plenty of fluids 6. Keep thermostat turn down low 7.For any cough or congestion- mucinex 8. For fever or aces or pains- take tylenol or ibuprofen appropriate for age and weight.  * for fevers greater than 101 orally you may alternate ibuprofen and tylenol every  3 hours.       If you have been instructed to have an in-person evaluation today at a local Urgent Care facility, please use the link below. It will take you to a list of all of our available [facility], including address, phone number and hours of operation. Please do not delay care.  [facility]  If you or a family member do not have a primary care provider, use the link below to schedule a visit and establish care. When you choose a [facility] primary care physician or advanced practice provider, you gain a long-term partner in health. Find a Primary Care Provider  Learn more about [facility]'s in-office and virtual care options: [facility]

## 2023-01-10 ENCOUNTER — Ambulatory Visit: Payer: No Typology Code available for payment source | Admitting: Nurse Practitioner

## 2023-01-10 ENCOUNTER — Encounter: Payer: Self-pay | Admitting: Nurse Practitioner

## 2023-01-10 ENCOUNTER — Telehealth: Payer: Self-pay | Admitting: Family

## 2023-01-10 VITALS — BP 121/79 | HR 66 | Ht 65.0 in | Wt 166.6 lb

## 2023-01-10 DIAGNOSIS — B029 Zoster without complications: Secondary | ICD-10-CM

## 2023-01-10 MED ORDER — VALACYCLOVIR HCL 1 G PO TABS: 1000.0000 mg | ORAL_TABLET | Freq: Two times a day (BID) | ORAL | 0 refills | Status: AC

## 2023-01-10 MED ORDER — GABAPENTIN 300 MG PO CAPS: 300.0000 mg | ORAL_CAPSULE | Freq: Three times a day (TID) | ORAL | 0 refills | Status: DC

## 2023-01-10 NOTE — Telephone Encounter (Signed)
Patient aware and verbalizes understanding. 

## 2023-01-10 NOTE — Telephone Encounter (Signed)
Pt wants to make sure her Valtrex and Gabapentin directions were sent in correctly because she thought [facility] told her to take the Valtrex 3x daily and the Gabapentin 2x daily but the directions on the meds dont say that.   Please clarify and call patient.

## 2023-01-10 NOTE — Telephone Encounter (Signed)
Sorry valtrex should be 3x a day and gabapentin can be 1 0r 2x a day. Preferrably 1x a day because can cause sedation.

## 2023-01-10 NOTE — Patient Instructions (Signed)

## 2023-01-10 NOTE — Progress Notes (Signed)
   Subjective:    Patient ID: Emma Stephenson, female    DOB: 28-Jun-1981, 42 y.o.   MRN: [id number]   Chief Complaint: Rash (Abdomen and right side /) and Herpes Zoster   Rash Pertinent negatives include no eye pain or shortness of breath.   RAsh taht started on right upper flank that started 2 nights ago. Was like a burning sensation and as wore on started to itch a little. Very sensitive to anything touching it. Ras has spread around  to abdomen. Rates 4/10 on pain scale. More irritating then painful.    There are no problems to display for this patient.      Review of Systems  Constitutional:  Negative for diaphoresis.  Eyes:  Negative for pain.  Respiratory:  Negative for shortness of breath.   Cardiovascular:  Negative for chest pain, palpitations and leg swelling.  Gastrointestinal:  Negative for abdominal pain.  Endocrine: Negative for polydipsia.  Skin:  Positive for rash.  Neurological:  Negative for dizziness, weakness and headaches.  Hematological:  Does not bruise/bleed easily.  All other systems reviewed and are negative.      Objective:   Physical Exam Vitals reviewed.  Constitutional:      Appearance: Normal appearance.  Cardiovascular:     Rate and Rhythm: Normal rate and regular rhythm.     Heart sounds: Normal heart sounds.  Pulmonary:     Effort: Pulmonary effort is normal.     Breath sounds: Normal breath sounds.  Skin:    General: Skin is warm.     Comments: Vesicular patchy area on right upper post flank radiaiting around to right front lower abdomen.  Neurological:     General: No focal deficit present.     Mental Status: She is alert and oriented to person, place, and time.  Psychiatric:        Mood and Affect: Mood normal.        Behavior: Behavior normal.    BP 121/79   Pulse 66   Ht 5\' 5"  (1.651 m)   Wt 166 lb 9.6 oz (75.6 kg)   SpO2 99%   BMI 27.72 kg/m         Assessment & Plan:   Emma Stephenson in today with chief complaint  of Rash (Abdomen and right side /) and Herpes Zoster   1. Herpes zoster without complication Avoid rubbing or scratching area Calamine  lotion is fine if helps with itching Cool compresses  - valACYclovir (VALTREX) 1000 MG tablet; Take 1 tablet (1,000 mg total) by mouth 2 (two) times daily for 10 days.  Dispense: 21 tablet; Refill: 0 - gabapentin (NEURONTIN) 300 MG capsule; Take 1 capsule (300 mg total) by mouth 3 (three) times daily.  Dispense: 30 capsule; Refill: 0    The above assessment and management plan was discussed with the patient. The patient verbalized understanding of and has agreed to the management plan. Patient is aware to call the clinic if symptoms persist or worsen. Patient is aware when to return to the clinic for a follow-up visit. Patient educated on when it is appropriate to go to the emergency department.   Mary-Margaret Daphine Deutscher, FNP

## 2023-05-22 ENCOUNTER — Telehealth: Payer: No Typology Code available for payment source | Admitting: Family

## 2023-05-22 ENCOUNTER — Encounter: Payer: Self-pay | Admitting: Family

## 2023-05-22 DIAGNOSIS — J069 Acute upper respiratory infection, unspecified: Secondary | ICD-10-CM | POA: Diagnosis not present

## 2023-05-22 MED ORDER — CETIRIZINE HCL 10 MG PO TABS: 10.0000 mg | ORAL_TABLET | Freq: Every day | ORAL | 1 refills | Status: AC

## 2023-05-22 MED ORDER — PREDNISONE 20 MG PO TABS: 40.0000 mg | ORAL_TABLET | Freq: Every day | ORAL | 0 refills | Status: AC

## 2023-05-22 MED ORDER — FLUTICASONE PROPIONATE 50 MCG/ACT NA SUSP: 2.0000 | Freq: Every day | NASAL | 6 refills | Status: DC

## 2023-05-22 NOTE — Progress Notes (Signed)
Virtual Visit Consent   Emma Stephenson, you are scheduled for a virtual visit with a [facility] provider today. Just as with appointments in the office, your consent must be obtained to participate. Your consent will be active for this visit and any virtual visit you may have with one of our providers in the next 365 days. If you have a MyChart account, a copy of this consent can be sent to you electronically.  As this is a virtual visit, video technology does not allow for your provider to perform a traditional examination. This may limit your provider's ability to fully assess your condition. If your provider identifies any concerns that need to be evaluated in person or the need to arrange testing (such as labs, EKG, etc.), we will make arrangements to do so. Although advances in technology are sophisticated, we cannot ensure that it will always work on either your end or our end. If the connection with a video visit is poor, the visit may have to be switched to a telephone visit. With either a video or telephone visit, we are not always able to ensure that we have a secure connection.  By engaging in this virtual visit, you consent to the provision of healthcare and authorize for your insurance to be billed (if applicable) for the services provided during this visit. Depending on your insurance coverage, you may receive a charge related to this service.  I need to obtain your verbal consent now. Are you willing to proceed with your visit today? MALINA STILLEY has provided verbal consent on 05/22/2023 for a virtual visit (video or telephone). Jannifer Rodney, FNP  Date: 05/22/2023 1:03 PM  Virtual Visit via Video Note   I, Jannifer Rodney, connected with  MESHELLE DUNKLEE  ([id number], 09/20/1980) on 05/22/23 at  5:45 PM EDT by a video-enabled telemedicine application and verified that I am speaking with the correct person using two identifiers.  Location: Patient: Virtual Visit Location Patient: Home Provider:  Virtual Visit Location Provider: Home Office   I discussed the limitations of evaluation and management by telemedicine and the availability of in person appointments. The patient expressed understanding and agreed to proceed.    History of Present Illness: Emma Stephenson is a 42 y.o. who identifies as a female who was assigned female at birth, and is being seen today for sore throat that started 4 days ago. Did a home COVID test that was negative.   HPI: URI  This is a new problem. The current episode started in the past 7 days. There has been no fever. Associated symptoms include congestion, coughing, ear pain, headaches, rhinorrhea and sinus pain. Pertinent negatives include no diarrhea, dysuria, joint pain, neck pain, plugged ear sensation, sore throat, swollen glands or vomiting. She has tried acetaminophen for the symptoms. The treatment provided mild relief.    Problems: There are no problems to display for this patient.   Allergies: No Known Allergies Medications:  Current Outpatient Medications:    cetirizine (ZYRTEC ALLERGY) 10 MG tablet, Take 1 tablet (10 mg total) by mouth daily., Disp: 90 tablet, Rfl: 1   fluticasone (FLONASE) 50 MCG/ACT nasal spray, Place 2 sprays into both nostrils daily., Disp: 16 g, Rfl: 6   predniSONE (DELTASONE) 20 MG tablet, Take 2 tablets (40 mg total) by mouth daily with breakfast for 5 days., Disp: 10 tablet, Rfl: 0   gabapentin (NEURONTIN) 300 MG capsule, Take 1 capsule (300 mg total) by mouth 3 (three) times daily.,  Disp: 30 capsule, Rfl: 0   norethindrone-ethinyl estradiol (JUNEL FE,GILDESS FE,LOESTRIN FE) 1-20 MG-MCG tablet, Take 1 tablet by mouth daily., Disp: , Rfl:    SRONYX 0.1-20 MG-MCG tablet, Take 1 tablet by mouth daily., Disp: , Rfl:   Observations/Objective: Patient is well-developed, well-nourished in no acute distress.  Resting comfortably at home.  Head is normocephalic, atraumatic.  No labored breathing.  Speech is clear and coherent  with logical content.  Patient is alert and oriented at baseline.  Nasal congestion   Assessment and Plan: 1. Viral URI - fluticasone (FLONASE) 50 MCG/ACT nasal spray; Place 2 sprays into both nostrils daily.  Dispense: 16 g; Refill: 6 - cetirizine (ZYRTEC ALLERGY) 10 MG tablet; Take 1 tablet (10 mg total) by mouth daily.  Dispense: 90 tablet; Refill: 1 - predniSONE (DELTASONE) 20 MG tablet; Take 2 tablets (40 mg total) by mouth daily with breakfast for 5 days.  Dispense: 10 tablet; Refill: 0  - Take meds as prescribed - Use a cool mist humidifier  -Use saline nose sprays frequently -Force fluids -For any cough or congestion  Use plain Mucinex- regular strength or max strength is fine -For fever or aces or pains- take tylenol or ibuprofen. -Throat lozenges if help -New toothbrush in 3 days -Follow up if symptoms worsen or do not improve    Follow Up Instructions: I discussed the assessment and treatment plan with the patient. The patient was provided an opportunity to ask questions and all were answered. The patient agreed with the plan and demonstrated an understanding of the instructions.  A copy of instructions were sent to the patient via MyChart unless otherwise noted below.     The patient was advised to call back or seek an in-person evaluation if the symptoms worsen or if the condition fails to improve as anticipated.  Time:  I spent 9 minutes with the patient via telehealth technology discussing the above problems/concerns.    Jannifer Rodney, FNP

## 2023-05-22 NOTE — Patient Instructions (Signed)

## 2023-11-26 ENCOUNTER — Telehealth (INDEPENDENT_AMBULATORY_CARE_PROVIDER_SITE_OTHER): Admitting: Nurse Practitioner

## 2023-11-26 ENCOUNTER — Ambulatory Visit: Admitting: Family Medicine

## 2023-11-26 ENCOUNTER — Encounter: Payer: Self-pay | Admitting: Nurse Practitioner

## 2023-11-26 DIAGNOSIS — J019 Acute sinusitis, unspecified: Secondary | ICD-10-CM | POA: Insufficient documentation

## 2023-11-26 MED ORDER — AMOXICILLIN 875 MG PO TABS: 875.0000 mg | ORAL_TABLET | Freq: Two times a day (BID) | ORAL | 0 refills | Status: DC

## 2023-11-26 MED ORDER — AZELASTINE HCL 0.1 % NA SOLN: 1.0000 | Freq: Two times a day (BID) | NASAL | 0 refills | Status: DC

## 2023-11-26 NOTE — Progress Notes (Signed)
 Virtual Visit via video Note Due to COVID-19 pandemic this visit was conducted virtually. This visit type was conducted due to national recommendations for restrictions regarding the COVID-19 Pandemic (e.g. social distancing, sheltering in place) in an effort to limit this patient's exposure and mitigate transmission in our community. All issues noted in this document were discussed and addressed.  A physical exam was not performed with this format.   I connected with Emma Stephenson on 11/26/2023 at 0850 by name and DOB and verified that I am speaking with the correct person using two identifiers. Emma Stephenson is currently located at home  during visit. The provider, Martina Sinner, DNP is located in their office at time of home.  I discussed the limitations, risks, security and privacy concerns of performing an evaluation and management service by virtual visit and the availability of in person appointments. I also discussed with the patient that there may be a patient responsible charge related to this service. The patient expressed understanding and agreed to proceed.  Subjective:  Patient ID: Emma Stephenson, female    DOB: 1981-01-23, 43 y.o.   MRN: [id number]  Chief Complaint:  Facial Pain and Sinusitis ("Congested, facial since last Wed")   HPI: Emma Stephenson is a 43 y.o. female presenting on 11/26/2023 for Facial Pain and Sinusitis ("Congested, facial since last Wed")   Sinusitis: Patient presents with chronic sinusitis. The patient reports chronic sinus infections for 7 days.  Her symptoms include nasal congestion, purulent rhinorrhea, cough, facial pain.  There has not been a history of fevers, foul breath, mouthbreathing. There does not have been a history of chronic otitis media or pharyngotonsillitis.  Prior antibiotic therapy has included no recent courses. Other medications have included Flonase, oral decongestants.  She does not have had allergy testing which was not done.       Relevant past medical, surgical, family, and social history reviewed and updated as indicated.  Allergies and medications reviewed and updated.   History reviewed. No pertinent past medical history.  Past Surgical History:  Procedure Laterality Date   BACK SURGERY      Social History   Socioeconomic History   Marital status: Married    Spouse name: Not on file   Number of children: Not on file   Years of education: Not on file   Highest education level: Not on file  Occupational History   Not on file  Tobacco Use   Smoking status: Never   Smokeless tobacco: Never  Substance and Sexual Activity   Alcohol use: Yes    Comment: occasional   Drug use: Never   Sexual activity: Not on file  Other Topics Concern   Not on file  Social History Narrative   Not on file   Social Drivers of Health   Financial Resource Strain: Low Risk  (01/10/2023)   Overall Financial Resource Strain (CARDIA)    Difficulty of Paying Living Expenses: Not hard at all  Food Insecurity: No Food Insecurity (01/10/2023)   Hunger Vital Sign    Worried About Running Out of Food in the Last Year: Never true    Ran Out of Food in the Last Year: Never true  Transportation Needs: No Transportation Needs (01/10/2023)   PRAPARE - Administrator, Civil Service (Medical): No    Lack of Transportation (Non-Medical): No  Physical Activity: Insufficiently Active (01/10/2023)   Exercise Vital Sign    Days of Exercise per Week: 3  days    Minutes of Exercise per Session: 40 min  Stress: No Stress Concern Present (01/10/2023)   [employer] of Occupational Health - Occupational Stress Questionnaire    Feeling of Stress : Not at all  Social Connections: Unknown (01/10/2023)   Social Connection and Isolation Panel [NHANES]    Frequency of Communication with Friends and Family: More than three times a week    Frequency of Social Gatherings with Friends and Family: More than three times a week    Attends  Religious Services: Patient declined    Database administrator or Organizations: Patient declined    Attends Banker Meetings: Not on file    Marital Status: Married  Intimate Partner Violence: Not on file    Outpatient Encounter Medications as of 11/26/2023  Medication Sig   amoxicillin (AMOXIL) 875 MG tablet Take 1 tablet (875 mg total) by mouth 2 (two) times daily.   azelastine (ASTELIN) 0.1 % nasal spray Place 1 spray into both nostrils 2 (two) times daily. Use in each nostril as directed   cetirizine (ZYRTEC ALLERGY) 10 MG tablet Take 1 tablet (10 mg total) by mouth daily.   fluticasone (FLONASE) 50 MCG/ACT nasal spray Place 2 sprays into both nostrils daily.   gabapentin (NEURONTIN) 300 MG capsule Take 1 capsule (300 mg total) by mouth 3 (three) times daily.   norethindrone-ethinyl estradiol (JUNEL FE,GILDESS FE,LOESTRIN FE) 1-20 MG-MCG tablet Take 1 tablet by mouth daily.   SRONYX 0.1-20 MG-MCG tablet Take 1 tablet by mouth daily.   No facility-administered encounter medications on file as of 11/26/2023.    No Known Allergies  Review of Systems  Constitutional:  Negative for fatigue and fever.  HENT:  Positive for congestion, ear pain, facial swelling, postnasal drip, sinus pressure and sinus pain. Negative for sore throat.   Eyes:  Negative for pain and redness.  Respiratory:  Positive for cough. Negative for apnea, chest tightness, shortness of breath and wheezing.   Cardiovascular:  Negative for chest pain and leg swelling.  Gastrointestinal:  Negative for diarrhea, nausea and vomiting.  Musculoskeletal:  Negative for back pain, joint swelling and neck pain.  Skin:  Negative for color change, pallor and rash.  Neurological:  Negative for dizziness and headaches.     Observations/Objective: No vital signs or physical exam, this was a virtual health encounter.  Pt alert and oriented, answers all questions appropriately, and able to speak in full sentences.     Assessment and Plan: [name] was seen today for facial pain and sinusitis.  Diagnoses and all orders for this visit:  Acute non-recurrent sinusitis, unspecified location -     azelastine (ASTELIN) 0.1 % nasal spray; Place 1 spray into both nostrils 2 (two) times daily. Use in each nostril as directed -     amoxicillin (AMOXIL) 875 MG tablet; Take 1 tablet (875 mg total) by mouth 2 (two) times daily.   Blossom 43 year old Caucasian female seen today via telehealth for sinusitis, no acute distress Sinusitis will treat with amoxicillin 875 for 7 days; Astelin nasal spray twice daily, okay to continue Flonase 1. Take meds as prescribed 2. Use a cool mist humidifier especially during the winter months and when heat has been humid. 3. Use saline nose sprays frequently. 4. Saline irrigations of the nose can be very helpful if done frequently. Use 4 times daily for one week. Can use Nettie Pot if able to tolerate.  5. Drink plenty of fluids 6. Keep thermostat turned  down low. 7.For any cough or congestion: Use plain Mucinex- regular strength or max strength is fine. 8. For fever or aces or pains- take tylenol or ibuprofen appropriate for age and weight, for fevers greater than 101 orally you may alternate ibuprofen and tylenol every 3 hours.    Follow Up Instructions: Return if symptoms worsen or fail to improve.    I discussed the assessment and treatment plan with the patient. The patient was provided an opportunity to ask questions and all were answered. The patient agreed with the plan and demonstrated an understanding of the instructions.   The patient was advised to call back or seek an in-person evaluation if the symptoms worsen or if the condition fails to improve as anticipated.  The above assessment and management plan was discussed with the patient. The patient verbalized understanding of and has agreed to the management plan. Patient is aware to call the clinic if they develop any  new symptoms or if symptoms persist or worsen. Patient is aware when to return to the clinic for a follow-up visit. Patient educated on when it is appropriate to go to the emergency department.    I provided video minutes of time during this 12 encounter.   Arrie Aran Santa Lighter, DNP [facility] [address] [place], [place] [address] [phone] 11/26/2023

## 2023-12-18 ENCOUNTER — Other Ambulatory Visit: Payer: Self-pay | Admitting: Nurse Practitioner

## 2023-12-18 DIAGNOSIS — J019 Acute sinusitis, unspecified: Secondary | ICD-10-CM

## 2024-03-16 ENCOUNTER — Other Ambulatory Visit: Payer: Self-pay | Admitting: Family

## 2024-03-16 DIAGNOSIS — J019 Acute sinusitis, unspecified: Secondary | ICD-10-CM

## 2024-04-01 ENCOUNTER — Ambulatory Visit (INDEPENDENT_AMBULATORY_CARE_PROVIDER_SITE_OTHER): Admitting: Family Medicine

## 2024-04-01 ENCOUNTER — Encounter: Payer: Self-pay | Admitting: Family Medicine

## 2024-04-01 VITALS — BP 129/87 | HR 65 | Temp 97.0°F | Ht 65.0 in | Wt 172.2 lb

## 2024-04-01 DIAGNOSIS — H66011 Acute suppurative otitis media with spontaneous rupture of ear drum, right ear: Secondary | ICD-10-CM

## 2024-04-01 MED ORDER — CIPROFLOXACIN-DEXAMETHASONE 0.3-0.1 % OT SUSP: 2.0000 [drp] | Freq: Two times a day (BID) | OTIC | 0 refills | Status: AC

## 2024-04-01 NOTE — Progress Notes (Signed)
 Subjective:  Patient ID: Emma Stephenson, female    DOB: 12/13/1980, 43 y.o.   MRN: [id number]  Patient Care Team: Lavell Bari LABOR, FNP as PCP - General (Family Medicine)   Chief Complaint:  Ear Pain (Right x 3 days.  Patient states that her right ear has also been draining )   HPI: Emma Stephenson is a 43 y.o. female presenting on 04/01/2024 for Ear Pain (Right x 3 days.  Patient states that her right ear has also been draining )   Emma Stephenson is a 43 year old female who presents with right ear pain and drainage.  She began experiencing right ear pain on Saturday at midday, which intensified by the evening with significant pressure. She attempted to relieve the pressure by holding her nose, resulting in the ear popping and subsequent drainage overnight.  Since the drainage, she notes crackling sounds in the ear when swallowing and persistent soreness, describing the sensation as the 'whole inside of my ear is swollen.' No fever or chills have been experienced.  She frequently swims, which may have contributed to her symptoms. On Sunday, she attempted to use alcohol drops in her ear, as suggested by her mother, but found it extremely painful, describing it as feeling 'on fire.'  She has not had an ear infection since she was about 43 years old. She uses an over-the-ear device at work.          Relevant past medical, surgical, family, and social history reviewed and updated as indicated.  Allergies and medications reviewed and updated. Data reviewed: Chart in Epic.   History reviewed. No pertinent past medical history.  Past Surgical History:  Procedure Laterality Date   BACK SURGERY      Social History   Socioeconomic History   Marital status: Married    Spouse name: Not on file   Number of children: Not on file   Years of education: Not on file   Highest education level: Not on file  Occupational History   Not on file  Tobacco Use   Smoking status: Never   Smokeless  tobacco: Never  Substance and Sexual Activity   Alcohol use: Yes    Comment: occasional   Drug use: Never   Sexual activity: Not on file  Other Topics Concern   Not on file  Social History Narrative   Not on file   Social Drivers of Health   Financial Resource Strain: Low Risk  (04/01/2024)   Overall Financial Resource Strain (CARDIA)    Difficulty of Paying Living Expenses: Not hard at all  Food Insecurity: No Food Insecurity (04/01/2024)   Hunger Vital Sign    Worried About Running Out of Food in the Last Year: Never true    Ran Out of Food in the Last Year: Never true  Transportation Needs: No Transportation Needs (04/01/2024)   PRAPARE - Administrator, Civil Service (Medical): No    Lack of Transportation (Non-Medical): No  Physical Activity: Sufficiently Active (04/01/2024)   Exercise Vital Sign    Days of Exercise per Week: 4 days    Minutes of Exercise per Session: 40 min  Stress: No Stress Concern Present (04/01/2024)   [employer] of Occupational Health - Occupational Stress Questionnaire    Feeling of Stress: Not at all  Social Connections: Socially Integrated (04/01/2024)   Social Connection and Isolation Panel    Frequency of Communication with Friends and Family: More than three  times a week    Frequency of Social Gatherings with Friends and Family: More than three times a week    Attends Religious Services: More than 4 times per year    Active Member of Clubs or Organizations: Yes    Attends Banker Meetings: More than 4 times per year    Marital Status: Married  Catering manager Violence: Not on file    Outpatient Encounter Medications as of 04/01/2024  Medication Sig   cetirizine  (ZYRTEC  ALLERGY) 10 MG tablet Take 1 tablet (10 mg total) by mouth daily.   ciprofloxacin -dexamethasone  (CIPRODEX ) OTIC suspension Place 2 drops into the right ear 2 (two) times daily for 10 days.   SRONYX 0.1-20 MG-MCG tablet Take 1 tablet by mouth daily.    [DISCONTINUED] amoxicillin  (AMOXIL ) 875 MG tablet Take 1 tablet (875 mg total) by mouth 2 (two) times daily.   [DISCONTINUED] Azelastine  HCl 137 MCG/SPRAY SOLN PLACE 1 SPRAY INTO BOTH NOSTRILS 2 (TWO) TIMES DAILY. USE IN EACH NOSTRIL AS DIRECTED   [DISCONTINUED] fluticasone  (FLONASE ) 50 MCG/ACT nasal spray Place 2 sprays into both nostrils daily.   [DISCONTINUED] gabapentin  (NEURONTIN ) 300 MG capsule Take 1 capsule (300 mg total) by mouth 3 (three) times daily.   [DISCONTINUED] norethindrone-ethinyl estradiol (JUNEL FE,GILDESS FE,LOESTRIN FE) 1-20 MG-MCG tablet Take 1 tablet by mouth daily.   No facility-administered encounter medications on file as of 04/01/2024.    No Known Allergies  Pertinent ROS per HPI, otherwise unremarkable      Objective:  BP 129/87   Pulse 65   Temp (!) 97 F (36.1 C)   Ht 5' 5 (1.651 m)   Wt 172 lb 3.2 oz (78.1 kg)   LMP 03/27/2024   SpO2 99%   BMI 28.66 kg/m    Wt Readings from Last 3 Encounters:  04/01/24 172 lb 3.2 oz (78.1 kg)  01/10/23 166 lb 9.6 oz (75.6 kg)  11/27/18 153 lb (69.4 kg)    Physical Exam Vitals and nursing note reviewed.  Constitutional:      Appearance: Normal appearance.  HENT:     Head: Normocephalic and atraumatic.     Right Ear: Drainage and swelling present. Tympanic membrane is perforated and erythematous.     Left Ear: Tympanic membrane, ear canal and external ear normal.     Mouth/Throat:     Mouth: Mucous membranes are moist.  Eyes:     Conjunctiva/sclera: Conjunctivae normal.     Pupils: Pupils are equal, round, and reactive to light.  Cardiovascular:     Rate and Rhythm: Normal rate and regular rhythm.     Heart sounds: Normal heart sounds.  Pulmonary:     Effort: Pulmonary effort is normal.     Breath sounds: Normal breath sounds.  Musculoskeletal:     Cervical back: Neck supple.  Skin:    General: Skin is warm and dry.     Capillary Refill: Capillary refill takes less than 2 seconds.   Neurological:     General: No focal deficit present.     Mental Status: She is alert and oriented to person, place, and time.  Psychiatric:        Mood and Affect: Mood normal.        Behavior: Behavior normal. Behavior is cooperative.        Thought Content: Thought content normal.        Judgment: Judgment normal.     Results for orders placed or performed in visit on 11/27/18  Urine  Culture   Collection Time: 11/27/18  9:31 AM   Specimen: Urine   UR  Result Value Ref Range   Urine Culture, Routine Final report    Organism ID, Bacteria Comment   Microscopic Examination   Collection Time: 11/27/18  9:31 AM   URINE  Result Value Ref Range   WBC, UA 11-30 (A) 0 - 5 /hpf   RBC, UA 3-10 (A) 0 - 2 /hpf   Epithelial Cells (non renal) 0-10 0 - 10 /hpf   Renal Epithel, UA None seen None seen /hpf   Bacteria, UA Few (A) None seen/Few  Urinalysis, Complete   Collection Time: 11/27/18  9:31 AM  Result Value Ref Range   Specific Gravity, UA 1.020 1.005 - 1.030   pH, UA 6.5 5.0 - 7.5   Color, UA Yellow Yellow   Appearance Ur Clear Clear   Leukocytes, UA Trace (A) Negative   Protein, UA Negative Negative/Trace   Glucose, UA Negative Negative   Ketones, UA Negative Negative   RBC, UA 2+ (A) Negative   Bilirubin, UA Negative Negative   Urobilinogen, Ur 0.2 0.2 - 1.0 mg/dL   Nitrite, UA Negative Negative   Microscopic Examination See below:        Pertinent labs & imaging results that were available during my care of the patient were reviewed by me and considered in my medical decision making.  Assessment & Plan:  Armelia was seen today for ear pain.  Diagnoses and all orders for this visit:  Non-recurrent acute suppurative otitis media of right ear with spontaneous rupture of tympanic membrane -     ciprofloxacin -dexamethasone  (CIPRODEX ) OTIC suspension; Place 2 drops into the right ear 2 (two) times daily for 10 days.      Otitis Externa and Media (Right Ear) Acute onset  of right ear pain, pressure, drainage, and soreness since Saturday, indicative of otitis externa and media, likely due to frequent swimming. Examination shows swelling and infection in the ear canal and behind the tympanic membrane. Ciprodex , a combination antibiotic and steroid drop, is recommended to treat infection and inflammation. Avoidance of alcohol drops is crucial as she can worsen the condition. Emphasized proper ear care post-swimming to prevent recurrence. Insurance coverage for Ciprodex  may be problematic; alternative formulations can be considered if necessary. - Prescribe Ciprodex  ear drops: 2 drops in the right ear twice daily for 10 days. - Advise contacting the office if insurance does not cover Ciprodex  for alternative formulations. - Instruct to avoid using alcohol drops in the ear. - Recommend using swimmer's ear drops like Debrox post-swimming once the infection is healed. - Suggest using clay ear plugs to prevent water entry during swimming if prone to infections. - Advise switching the use of over-the-ear devices to the unaffected ear until healed.          Continue all other maintenance medications.  Follow up plan: Return if symptoms worsen or fail to improve.   Continue healthy lifestyle choices, including diet (rich in fruits, vegetables, and lean proteins, and low in salt and simple carbohydrates) and exercise (at least 30 minutes of moderate physical activity daily).  Educational handout given for swimmers ear  The above assessment and management plan was discussed with the patient. The patient verbalized understanding of and has agreed to the management plan. Patient is aware to call the clinic if they develop any new symptoms or if symptoms persist or worsen. Patient is aware when to return to the clinic for a follow-up  visit. Patient educated on when it is appropriate to go to the emergency department.   Rosaline Bruns, FNP-C [facility] [phone]

## 2024-06-07 DIAGNOSIS — B029 Zoster without complications: Secondary | ICD-10-CM | POA: Diagnosis not present

## 2024-09-08 ENCOUNTER — Telehealth: Payer: Self-pay | Admitting: Family Medicine

## 2024-09-08 ENCOUNTER — Telehealth (INDEPENDENT_AMBULATORY_CARE_PROVIDER_SITE_OTHER): Payer: Self-pay | Admitting: Family

## 2024-09-08 ENCOUNTER — Encounter: Payer: Self-pay | Admitting: Family

## 2024-09-08 DIAGNOSIS — Z20828 Contact with and (suspected) exposure to other viral communicable diseases: Secondary | ICD-10-CM

## 2024-09-08 MED ORDER — OSELTAMIVIR PHOSPHATE 75 MG PO CAPS: 75.0000 mg | ORAL_CAPSULE | Freq: Two times a day (BID) | ORAL | 0 refills | Status: AC

## 2024-09-08 NOTE — Telephone Encounter (Signed)
"  Appt made   "

## 2024-09-08 NOTE — Progress Notes (Signed)
 " Virtual Visit Consent   Emma Stephenson, you are scheduled for a virtual visit with a [facility] provider today. Just as with appointments in the office, your consent must be obtained to participate. Your consent will be active for this visit and any virtual visit you may have with one of our providers in the next 365 days. If you have a MyChart account, a copy of this consent can be sent to you electronically.  As this is a virtual visit, video technology does not allow for your provider to perform a traditional examination. This may limit your provider's ability to fully assess your condition. If your provider identifies any concerns that need to be evaluated in person or the need to arrange testing (such as labs, EKG, etc.), we will make arrangements to do so. Although advances in technology are sophisticated, we cannot ensure that it will always work on either your end or our end. If the connection with a video visit is poor, the visit may have to be switched to a telephone visit. With either a video or telephone visit, we are not always able to ensure that we have a secure connection.  By engaging in this virtual visit, you consent to the provision of healthcare and authorize for your insurance to be billed (if applicable) for the services provided during this visit. Depending on your insurance coverage, you may receive a charge related to this service.  I need to obtain your verbal consent now. Are you willing to proceed with your visit today? Emma Stephenson has provided verbal consent on 09/08/2024 for a virtual visit (video or telephone). Bari Learn, FNP  Date: 09/08/2024 3:30 PM   Virtual Visit via Video Note   I, Bari Learn, connected with  Emma Stephenson  ([id number], 1981/06/20) on 09/08/2024 at  2:10 PM EST by a video-enabled telemedicine application and verified that I am speaking with the correct person using two identifiers.  Location: Patient: Virtual Visit Location Patient:  Home Provider: Virtual Visit Location Provider: Home Office   I discussed the limitations of evaluation and management by telemedicine and the availability of in person appointments. The patient expressed understanding and agreed to proceed.    History of Present Illness: Emma Stephenson is a 43 y.o. who identifies as a female who was assigned female at birth, and is being seen today for flu exposure. Reports her son was diagnosed with Flu B.  She reports her husband just had a knee replacement and has several PT appointments and wanting medications prophetically to prevent flu.   Currently, she is not having any headaches, fever, body aches, or cough.    HPI: HPI  Problems: There are no active problems to display for this patient.   Allergies: Allergies[1] Medications: Current Medications[2]  Observations/Objective: Patient is well-developed, well-nourished in no acute distress.  Resting comfortably  at home.  Head is normocephalic, atraumatic.  No labored breathing.  Speech is clear and coherent with logical content.  Patient is alert and oriented at baseline.    Assessment and Plan: 1. Exposure to the flu (Primary) - oseltamivir  (TAMIFLU ) 75 MG capsule; Take 1 capsule (75 mg total) by mouth 2 (two) times daily.  Dispense: 10 capsule; Refill: 0  Start Tamiflu  daily. If any symptoms develop, increase to BID Encourage healthy diet and exercise Rest Follow up if symptoms worsen or do not improve    Follow Up Instructions: I discussed the assessment and treatment plan with the patient. The patient  was provided an opportunity to ask questions and all were answered. The patient agreed with the plan and demonstrated an understanding of the instructions.  A copy of instructions were sent to the patient via MyChart unless otherwise noted below.     The patient was advised to call back or seek an in-person evaluation if the symptoms worsen or if the condition fails to improve as  anticipated.    Bari Learn, FNP    [1] No Known Allergies [2]  Current Outpatient Medications:    oseltamivir  (TAMIFLU ) 75 MG capsule, Take 1 capsule (75 mg total) by mouth 2 (two) times daily., Disp: 10 capsule, Rfl: 0   cetirizine  (ZYRTEC  ALLERGY) 10 MG tablet, Take 1 tablet (10 mg total) by mouth daily., Disp: 90 tablet, Rfl: 1   SRONYX 0.1-20 MG-MCG tablet, Take 1 tablet by mouth daily., Disp: , Rfl:   "
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
## 2024-09-08 NOTE — Telephone Encounter (Signed)
 Copied from CRM [id number]. Topic: Clinical - Prescription Issue >> Sep 08, 2024 12:14 PM Cherylann RAMAN wrote: Reason for CRM: Patient requesting a preventative Tamflu medication due to son testing positive for Flu type B. Provider that seen patient's son recommended family request prescription from PCP for preventative Tamflu. Please contact patient at [phone] for additional information.

## 2024-09-08 NOTE — Patient Instructions (Signed)

## 2024-09-08 NOTE — Telephone Encounter (Signed)
 Please make patient a video visit appointment today.

## 2024-10-16 ENCOUNTER — Telehealth (INDEPENDENT_AMBULATORY_CARE_PROVIDER_SITE_OTHER): Admitting: Family Medicine

## 2024-10-16 ENCOUNTER — Encounter: Payer: Self-pay | Admitting: Family Medicine

## 2024-10-16 ENCOUNTER — Ambulatory Visit: Payer: Self-pay

## 2024-10-16 DIAGNOSIS — J0100 Acute maxillary sinusitis, unspecified: Secondary | ICD-10-CM

## 2024-10-16 MED ORDER — AMOXICILLIN 875 MG PO TABS: 875.0000 mg | ORAL_TABLET | Freq: Two times a day (BID) | ORAL | 0 refills | Status: AC
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
## 2024-10-16 NOTE — Telephone Encounter (Signed)
 noted

## 2024-10-16 NOTE — Progress Notes (Signed)
 "                    MyChart Video Visit    Virtual Visit via Video Note   This format is felt to be most appropriate for this patient at this time. Physical exam was limited by quality of the video and audio technology used for the visit.    Patient location: home Provider location: [facility] Persons involved in the visit: patient, provider  I discussed the limitations of evaluation and management by telemedicine and the availability of in person appointments. The patient expressed understanding and agreed to proceed.  Patient: TANZANIA BASHAM   DOB: 1981-05-03   44 y.o. Female  MRN: [id number] Visit Date: 10/16/2024  Today's healthcare provider: Annabella CHRISTELLA Search, FNP   No chief complaint on file.   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  Sinus congestion and facial pressure - Sinus congestion and facial pressure present for approximately eight days - Pressure is most noticeable when lying down - Facial pressure localized to cheeks and jawbone  Nasal discharge and epistaxis - Nasal discharge is green and occasionally mixed with blood - Three episodes of epistaxis during this period  Cough and sputum production - Persistent cough - Expectorating green sputum  Associated symptoms - Initially had sore throat and ear pain, both have resolved - No fever, shortness of breath, wheezing, or chest pain  Symptom management - Initially used Tylenol  Sinus without improvement - Switched to Sudafed with some improvement but incomplete resolution of symptoms       ROS As per HPI.       Objective:    There were no vitals taken for this visit.      Physical Exam Vitals and nursing note reviewed.  Constitutional:      General: She is not in acute distress.    Appearance: She is not ill-appearing, toxic-appearing or diaphoretic.   Pulmonary:     Effort: Pulmonary effort is normal.  Neurological:     Mental Status: She is alert and oriented to person, place, and time.  Psychiatric:        Mood and Affect: Mood normal.        Behavior: Behavior normal.         Assessment & Plan:    Diagnoses and all orders for this visit:  Acute non-recurrent maxillary sinusitis -     amoxicillin  (AMOXIL ) 875 MG tablet; Take 1 tablet (875 mg total) by mouth 2 (two) times daily for 7 days.   Assessment and Plan    Acute maxillary sinusitis Symptoms persistent despite initial treatment. No fever or respiratory distress. Ear pain and sore throat resolved. - Prescribed amoxicillin  BID for 7 days. - Advised to contact if no improvement        Meds ordered this encounter  Medications   amoxicillin  (AMOXIL ) 875 MG tablet    Sig: Take 1 tablet (875 mg total) by mouth 2 (two) times daily for 7 days.    Dispense:  14 tablet    Refill:  0    Supervising Provider:   JOLINDA NORENE CHRISTELLA [[id number]]     Return if symptoms worsen or fail to improve.     I discussed the assessment and treatment plan with the patient. The patient was provided an opportunity to ask questions and  all were answered. The patient agreed with the plan and demonstrated an understanding of the instructions.   The patient was advised to call back or seek an in-person evaluation if the symptoms worsen or if the condition fails to improve as anticipated.  I provided 7 minutes of non-face-to-face time during this encounter.  Annabella CHRISTELLA Search, FNP [facility]    "
